# Patient Record
Sex: Male | Born: 1960
Health system: Southern US, Community
[De-identification: ages and names within clinical notes are randomized; demographics above are authoritative.]

## PROBLEM LIST (undated history)

## (undated) DIAGNOSIS — F329 Major depressive disorder, single episode, unspecified: Secondary | ICD-10-CM

## (undated) DIAGNOSIS — M779 Enthesopathy, unspecified: Secondary | ICD-10-CM

## (undated) DIAGNOSIS — R200 Anesthesia of skin: Secondary | ICD-10-CM

## (undated) DIAGNOSIS — J029 Acute pharyngitis, unspecified: Secondary | ICD-10-CM

## (undated) DIAGNOSIS — Z8619 Personal history of other infectious and parasitic diseases: Secondary | ICD-10-CM

## (undated) DIAGNOSIS — Z87898 Personal history of other specified conditions: Secondary | ICD-10-CM

## (undated) DIAGNOSIS — K219 Gastro-esophageal reflux disease without esophagitis: Secondary | ICD-10-CM

## (undated) DIAGNOSIS — Z8719 Personal history of other diseases of the digestive system: Secondary | ICD-10-CM

## (undated) DIAGNOSIS — G44209 Tension-type headache, unspecified, not intractable: Secondary | ICD-10-CM

## (undated) DIAGNOSIS — T4145XA Adverse effect of unspecified anesthetic, initial encounter: Secondary | ICD-10-CM

## (undated) DIAGNOSIS — F32A Depression, unspecified: Secondary | ICD-10-CM

## (undated) DIAGNOSIS — J329 Chronic sinusitis, unspecified: Secondary | ICD-10-CM

## (undated) DIAGNOSIS — R112 Nausea with vomiting, unspecified: Secondary | ICD-10-CM

## (undated) DIAGNOSIS — Z9889 Other specified postprocedural states: Secondary | ICD-10-CM

## (undated) DIAGNOSIS — N529 Male erectile dysfunction, unspecified: Secondary | ICD-10-CM

## (undated) DIAGNOSIS — E7800 Pure hypercholesterolemia, unspecified: Secondary | ICD-10-CM

## (undated) DIAGNOSIS — G5710 Meralgia paresthetica, unspecified lower limb: Secondary | ICD-10-CM

## (undated) DIAGNOSIS — E559 Vitamin D deficiency, unspecified: Secondary | ICD-10-CM

## (undated) DIAGNOSIS — T8859XA Other complications of anesthesia, initial encounter: Secondary | ICD-10-CM

## (undated) HISTORY — PX: NASAL SEPTUM SURGERY: SHX37

## (undated) HISTORY — DX: Chronic sinusitis, unspecified: J32.9

## (undated) HISTORY — PX: LAPAROSCOPY: SHX197

## (undated) HISTORY — DX: Personal history of other specified conditions: Z87.898

## (undated) HISTORY — DX: Personal history of other infectious and parasitic diseases: Z86.19

## (undated) HISTORY — DX: Acute pharyngitis, unspecified: J02.9

## (undated) HISTORY — DX: Major depressive disorder, single episode, unspecified: F32.9

## (undated) HISTORY — DX: Male erectile dysfunction, unspecified: N52.9

## (undated) HISTORY — DX: Vitamin D deficiency, unspecified: E55.9

## (undated) HISTORY — DX: Pure hypercholesterolemia, unspecified: E78.00

## (undated) HISTORY — DX: Meralgia paresthetica, unspecified lower limb: G57.10

## (undated) HISTORY — PX: HERNIA REPAIR: SHX51

## (undated) HISTORY — DX: Depression, unspecified: F32.A

## (undated) HISTORY — DX: Enthesopathy, unspecified: M77.9

## (undated) HISTORY — DX: Gastro-esophageal reflux disease without esophagitis: K21.9

## (undated) HISTORY — DX: Anesthesia of skin: R20.0

## (undated) HISTORY — PX: MYRINGOTOMY: SUR874

## (undated) HISTORY — DX: Tension-type headache, unspecified, not intractable: G44.209

---

## 2006-12-05 DIAGNOSIS — Z720 Tobacco use: Secondary | ICD-10-CM | POA: Insufficient documentation

## 2006-12-05 DIAGNOSIS — K2100 Gastro-esophageal reflux disease with esophagitis, without bleeding: Secondary | ICD-10-CM | POA: Insufficient documentation

## 2007-08-22 ENCOUNTER — Ambulatory Visit: Payer: Self-pay | Admitting: Unknown Physician Specialty

## 2007-08-22 LAB — HM COLONOSCOPY

## 2009-03-03 ENCOUNTER — Ambulatory Visit: Payer: Self-pay | Admitting: Family Medicine

## 2009-08-15 DIAGNOSIS — F32A Depression, unspecified: Secondary | ICD-10-CM | POA: Insufficient documentation

## 2011-01-26 IMAGING — CR DG LUMBAR SPINE 2-3V
1 series · 3 of 3 positions shown · non-contrast
Comparison: none

REASON FOR EXAM: back pain
COMMENTS:

[Series 2: view not recorded · 0.17mm/px · 3 of 3 slices shown]
[im 1/3]
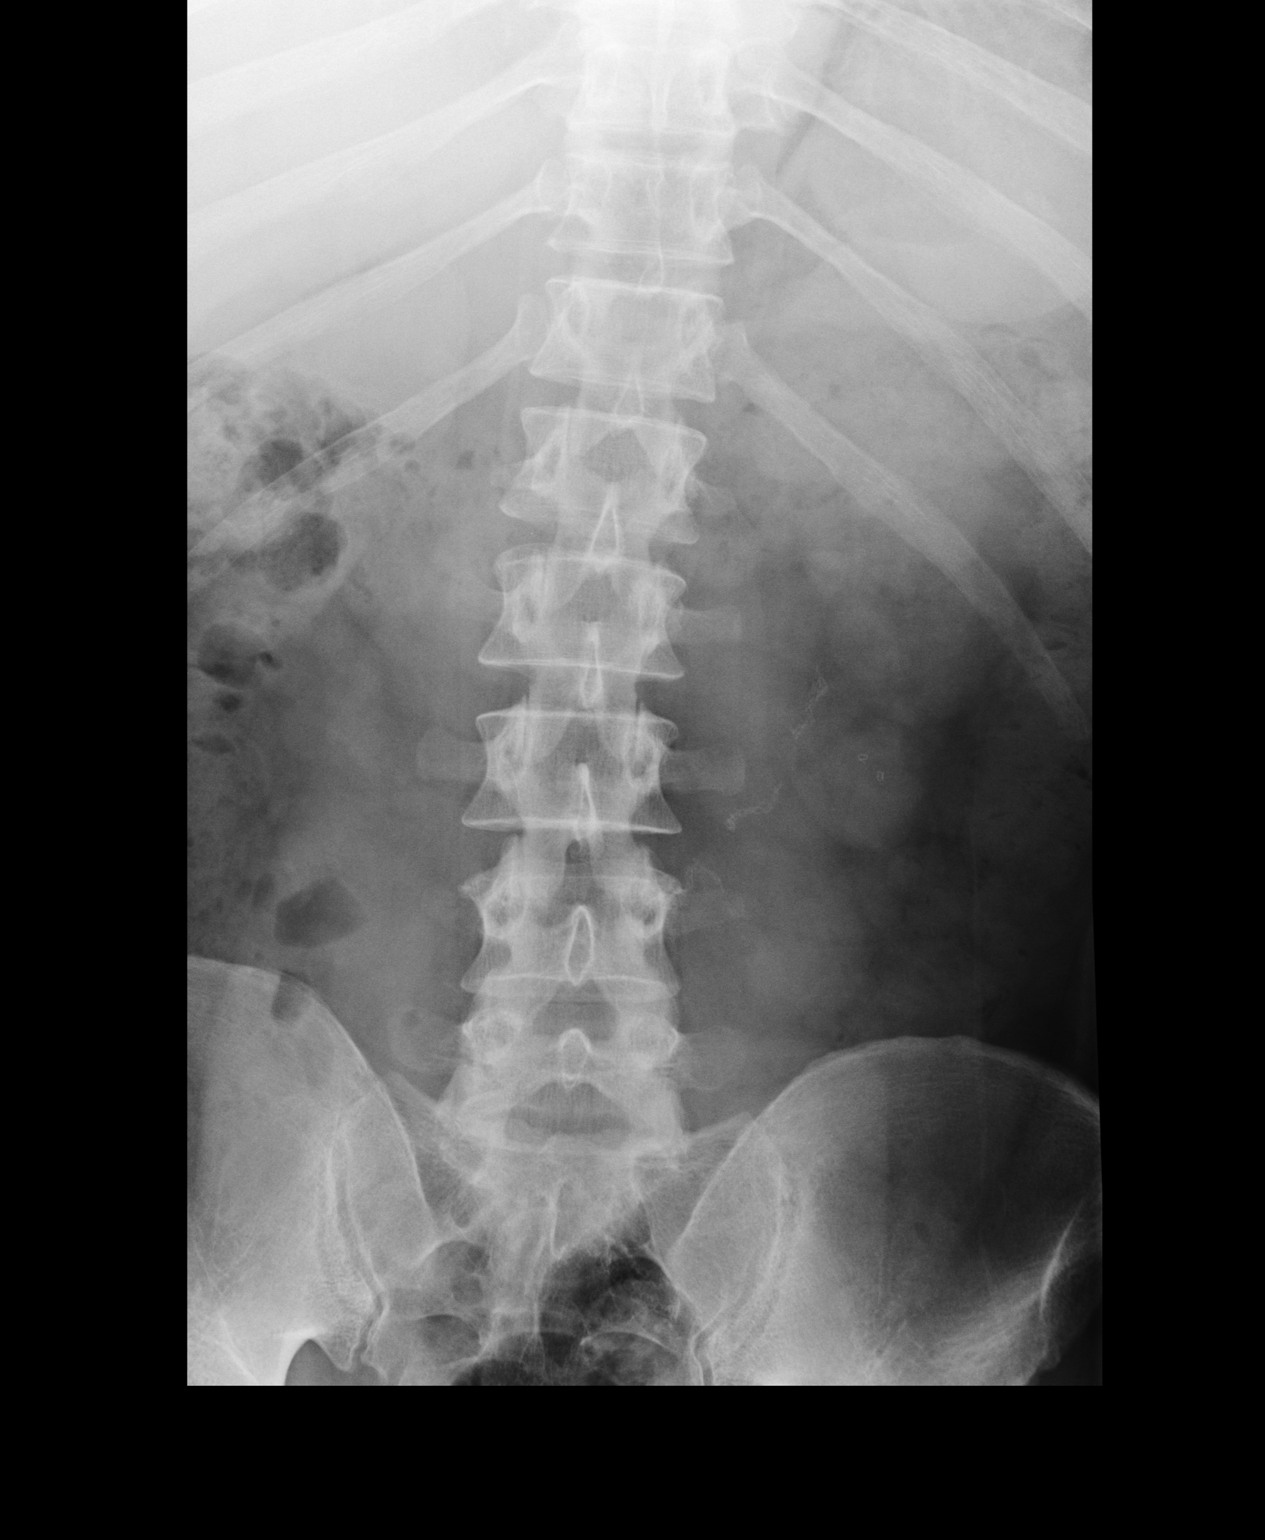
[im 2/3]
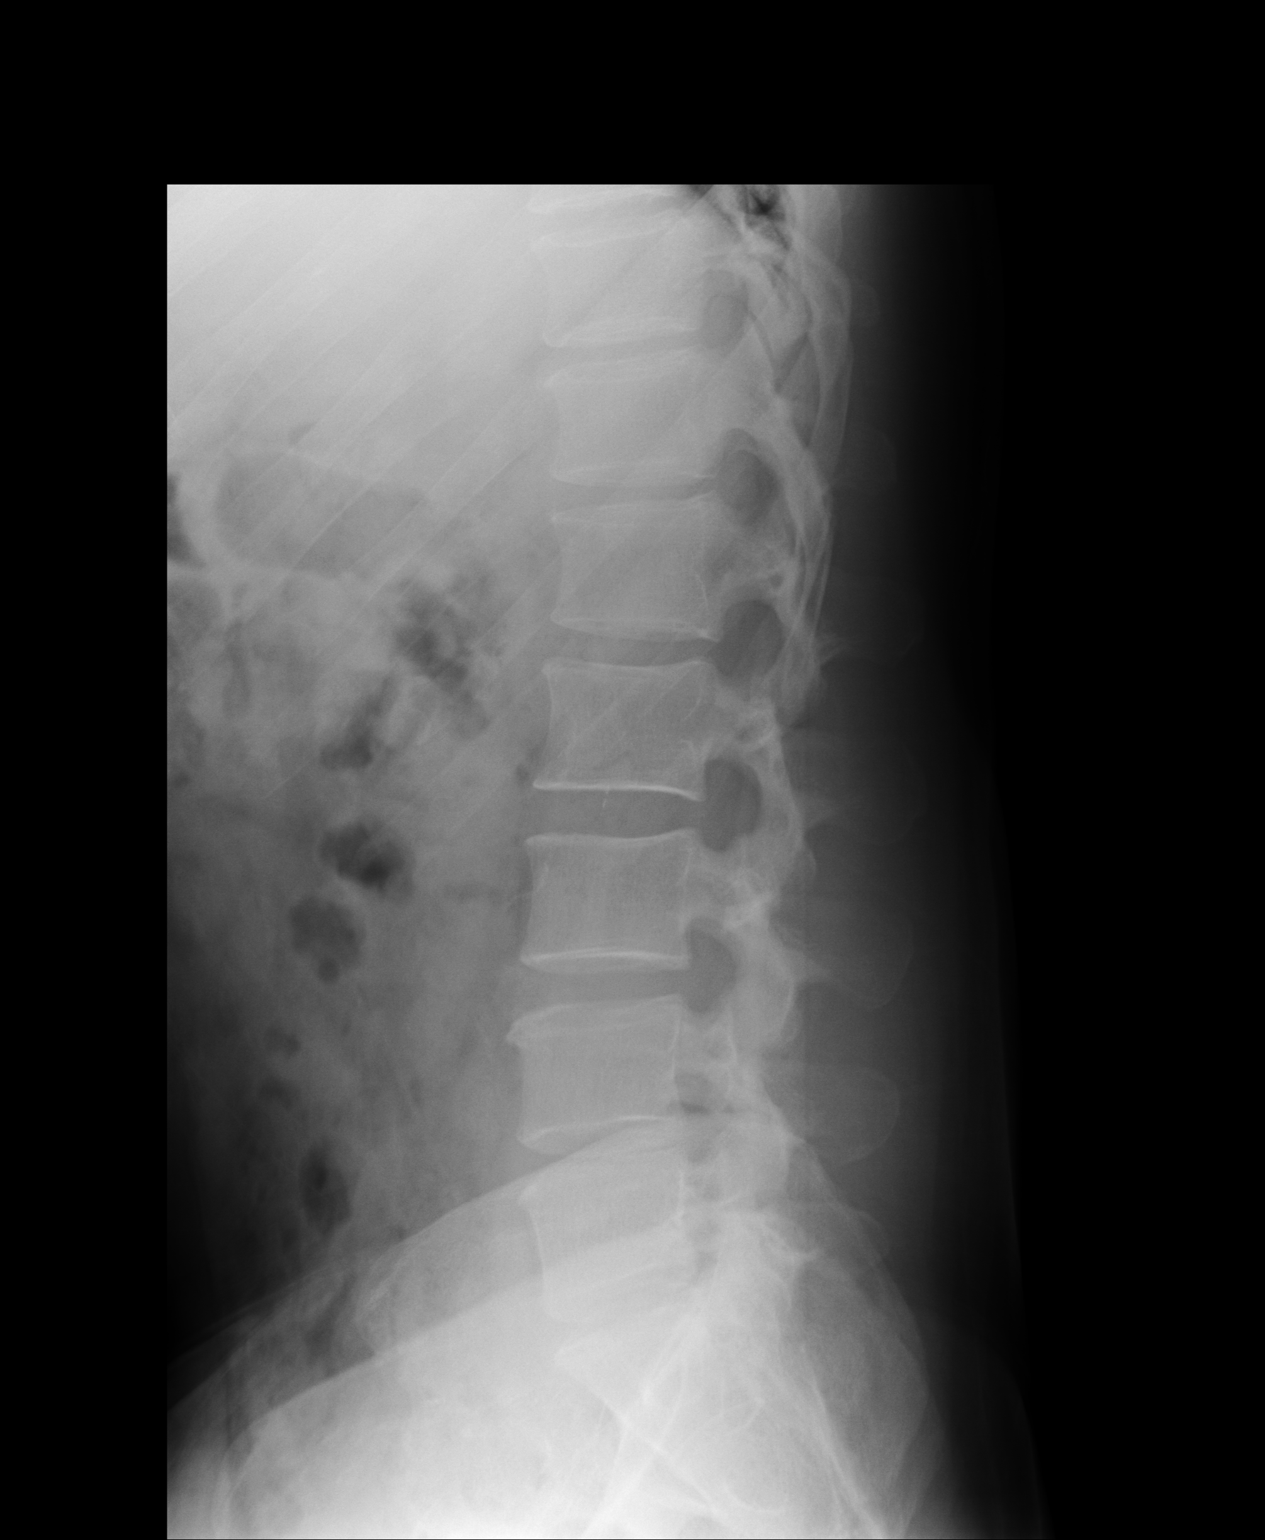
[im 3/3]
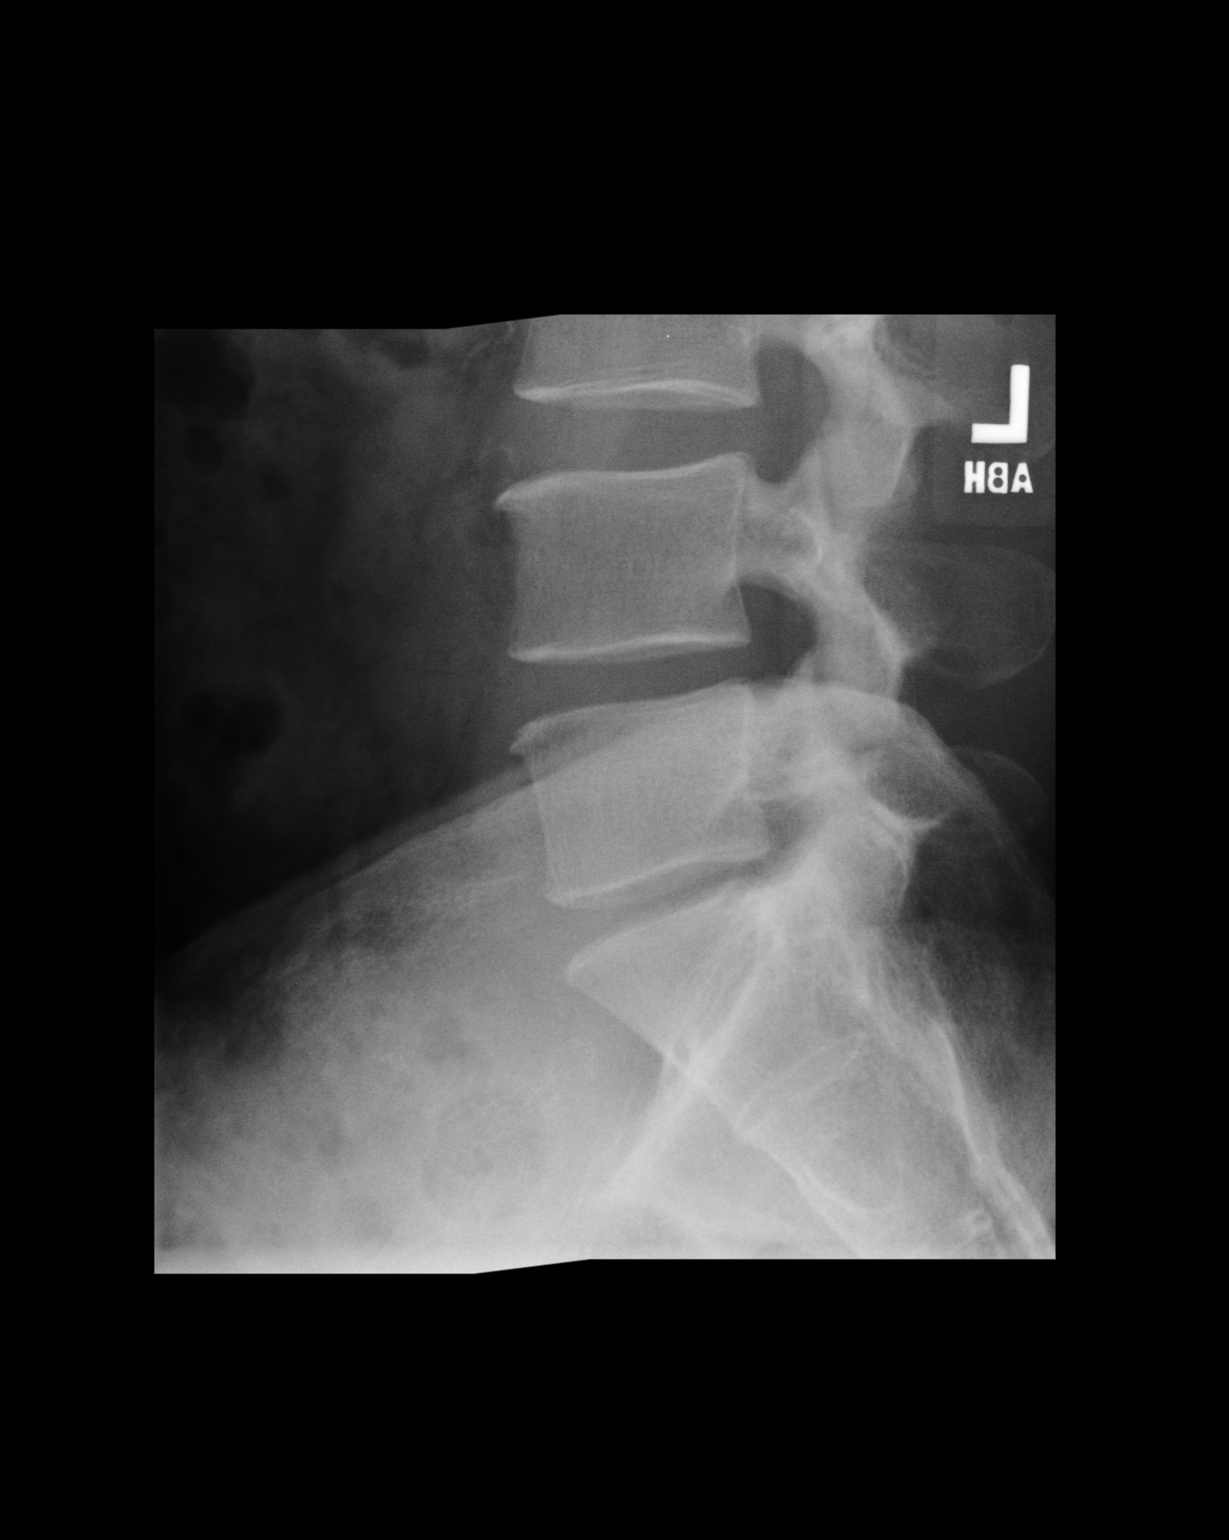

[3 of 3 positions shown; findings below may reference images not displayed]

PROCEDURE:     KDR - KDXR LUMBAR SPINE AP AND LATERAL  - March 03, 2009  [DATE]

RESULT:     Surgical changes are seen to the left of the spine in the mid
abdomen. The vertebral body heights and intervertebral disc spaces are
maintained with some slight narrowing at L5-S1. There is no compression
deformity or subluxation. There is no fracture or destructive lesion. There
is no sclerosis.
IMPRESSION: Minimal degenerative changes. Otherwise unremarkable exam.
MRI is available for further assessment, if desired.

## 2011-11-28 ENCOUNTER — Ambulatory Visit: Payer: Self-pay | Admitting: Surgery

## 2012-11-11 ENCOUNTER — Ambulatory Visit: Payer: Self-pay | Admitting: Family Medicine

## 2012-11-25 ENCOUNTER — Encounter: Payer: Self-pay | Admitting: Cardiovascular Disease

## 2012-11-25 ENCOUNTER — Ambulatory Visit (INDEPENDENT_AMBULATORY_CARE_PROVIDER_SITE_OTHER): Payer: No Typology Code available for payment source | Admitting: Cardiovascular Disease

## 2012-11-25 VITALS — BP 124/80 | HR 87 | Ht 68.0 in | Wt 183.5 lb

## 2012-11-25 DIAGNOSIS — R002 Palpitations: Secondary | ICD-10-CM

## 2012-11-25 DIAGNOSIS — E785 Hyperlipidemia, unspecified: Secondary | ICD-10-CM | POA: Insufficient documentation

## 2012-11-25 DIAGNOSIS — R5381 Other malaise: Secondary | ICD-10-CM

## 2012-11-25 DIAGNOSIS — R5383 Other fatigue: Secondary | ICD-10-CM

## 2012-11-25 MED ORDER — PROPRANOLOL HCL 20 MG PO TABS: 20.0000 mg | ORAL_TABLET | Freq: Three times a day (TID) | ORAL | Status: DC | PRN

## 2012-11-25 NOTE — Patient Instructions (Addendum)
You are doing well. No medication changes were made.  Try Red Yeast Rice for cholesterol  Please call us if you have new issues that need to be addressed before your next appt.     

## 2012-11-25 NOTE — Progress Notes (Signed)
Patient ID: Ronald M Welch Jr., male    DOB: 07-21-1960, 52 y.o.   MRN: [id number]  HPI Comments: Ronald Welch is a very pleasant 52 year old gentleman with a history of GERD, ADD, prostate issues, patient of Dr. Sullivan Lone who presents for evaluation of palpitations, fatigue. He has a history of hyperlipidemia, cholesterol he believes of 230.  He reports that in the past several weeks, he has had significant stress at work. He is self-employed, had several projects Causing significant stress. At the same time, he had a inner ear infection and was trying to wean himself off his amphetamine for ADD. He wonders if it was too much. He had profound fatigue and is going to sleep at 8:00 at nighttime and sleeping until early morning. Going to bed, he had significant heart fluttering, irregular beats, mixed with very strong beats. He had one episode of extreme fatigue while working in the yard trying to install a dog fence. Since his work stress has improved and he has gone back on his regular dose of ADD medication, which is below the prescription dose, he has felt better. He has been very active, working very hard in the yard. Work has been less stressful and he feels better overall. He denies any significant chest pain or shortness of breath with heavy exertion recently.  He does report having a stress test in 2006. At that time he was diagnosed with GERD.   he reports that his father has ahistory of coronary artery disease, MI in his 78s.  EKG today shows normal sinus rhythm with rate 87 beats per minute with no significant ST or T wave changes, PVC noted    Outpatient Encounter Prescriptions as of 11/25/2012  Medication Sig Dispense Refill  . amphetamine-dextroamphetamine (ADDERALL) 20 MG tablet Take 20 mg by mouth 2 (two) times daily as needed.      . CYCLOBENZAPRINE HCL PO Take 10 mg by mouth daily as needed.       . finasteride (PROSCAR) 5 MG tablet Take 5 mg by mouth daily.      Marland Kitchen omeprazole  (PRILOSEC) 20 MG capsule Take 20 mg by mouth daily.      . Probiotic Product (PROBIOTIC DAILY PO) Take by mouth daily.      . ranitidine (ZANTAC) 300 MG tablet Take 300 mg by mouth at bedtime.      . tamsulosin (FLOMAX) 0.4 MG CAPS Take by mouth.      Marland Kitchen VITAMIN D, ERGOCALCIFEROL, PO Take 5,000 mg by mouth daily.        Review of Systems  Constitutional: Positive for fatigue.  HENT: Negative.   Eyes: Negative.   Respiratory: Negative.   Cardiovascular: Positive for palpitations.  Gastrointestinal: Negative.   Musculoskeletal: Negative.   Skin: Negative.   Neurological: Negative.   Psychiatric/Behavioral: Negative.   All other systems reviewed and are negative.    BP 124/80  Pulse 87  Ht 5\' 8"  (1.727 m)  Wt 183 lb 8 oz (83.235 kg)  BMI 27.91 kg/m2  Physical Exam  Nursing note and vitals reviewed. Constitutional: He is oriented to person, place, and time. He appears well-developed and well-nourished.  HENT:  Head: Normocephalic.  Nose: Nose normal.  Mouth/Throat: Oropharynx is clear and moist.  Eyes: Conjunctivae are normal. Pupils are equal, round, and reactive to light.  Neck: Normal range of motion. Neck supple. No JVD present.  Cardiovascular: Normal rate, regular rhythm, S1 normal, S2 normal, normal heart sounds and intact distal pulses.  Exam reveals no gallop and no friction rub.   No murmur heard. Pulmonary/Chest: Effort normal and breath sounds normal. No respiratory distress. He has no wheezes. He has no rales. He exhibits no tenderness.  Abdominal: Soft. Bowel sounds are normal. He exhibits no distension. There is no tenderness.  Musculoskeletal: Normal range of motion. He exhibits no edema and no tenderness.  Lymphadenopathy:    He has no cervical adenopathy.  Neurological: He is alert and oriented to person, place, and time. Coordination normal.  Skin: Skin is warm and dry. No rash noted. No erythema.  Psychiatric: He has a normal mood and affect. His behavior  is normal. Judgment and thought content normal.      Assessment and Plan

## 2012-11-25 NOTE — Assessment & Plan Note (Signed)
PVCs seen on EKG today. I suspect he may of had ectopy while trying to change his Adderall dosing, work stress, while having in your infection. If symptoms present again, we have given him a prescription for propranolol to take when necessary. We could also perform a Holter monitor if needed.Marland Kitchen

## 2012-11-25 NOTE — Assessment & Plan Note (Signed)
Etiology of his fatigue is uncertain. It has improved as his work stress has resolved. Also better as he is taking a regular per small dose of his Adderall daily. As he feels well and is active and able to exert himself without any symptoms over the past week and this past weekend, we will hold off on any further testing at this time. If symptoms recur, we have offered him echocardiogram and stress testing.

## 2012-11-25 NOTE — Assessment & Plan Note (Addendum)
He reports cholesterol is high, approximately 230. He prefers not to take prescription medication. Father recently had MI and stent placement in his 64s. He will try red yeast rice.

## 2013-07-01 ENCOUNTER — Ambulatory Visit: Payer: No Typology Code available for payment source | Admitting: Cardiovascular Disease

## 2013-07-16 ENCOUNTER — Ambulatory Visit: Payer: No Typology Code available for payment source | Admitting: Cardiovascular Disease

## 2013-07-31 ENCOUNTER — Ambulatory Visit: Payer: No Typology Code available for payment source | Admitting: Cardiovascular Disease

## 2013-08-13 ENCOUNTER — Ambulatory Visit: Payer: No Typology Code available for payment source | Admitting: Cardiovascular Disease

## 2013-09-11 ENCOUNTER — Ambulatory Visit: Payer: No Typology Code available for payment source | Admitting: Cardiovascular Disease

## 2013-09-29 ENCOUNTER — Encounter (INDEPENDENT_AMBULATORY_CARE_PROVIDER_SITE_OTHER): Payer: Self-pay

## 2013-09-29 ENCOUNTER — Ambulatory Visit (INDEPENDENT_AMBULATORY_CARE_PROVIDER_SITE_OTHER): Payer: 59 | Admitting: Cardiovascular Disease

## 2013-09-29 ENCOUNTER — Encounter: Payer: Self-pay | Admitting: Cardiovascular Disease

## 2013-09-29 VITALS — BP 110/70 | HR 88 | Ht 68.0 in | Wt 179.2 lb

## 2013-09-29 DIAGNOSIS — N400 Enlarged prostate without lower urinary tract symptoms: Secondary | ICD-10-CM

## 2013-09-29 DIAGNOSIS — R002 Palpitations: Secondary | ICD-10-CM

## 2013-09-29 DIAGNOSIS — E785 Hyperlipidemia, unspecified: Secondary | ICD-10-CM

## 2013-09-29 DIAGNOSIS — R55 Syncope and collapse: Secondary | ICD-10-CM

## 2013-09-29 NOTE — Assessment & Plan Note (Signed)
Propranolol could be used for symptomatic palpitations. This is not a major issue on today's visit

## 2013-09-29 NOTE — Assessment & Plan Note (Signed)
He is currently on 2 medications as detailed above. We have suggested he try changing the timing of these medications and does not take them both together in the morning

## 2013-09-29 NOTE — Patient Instructions (Addendum)
You are doing well.  Try to take the prostate medications at alternate times to avoid near-syncope   We will check you lipids Try Red Yeast Rice for cholesterol if numbers are high   Please call us if you have new issues that need to be addressed before your next appt.  Your physician wants you to follow-up in: 12 months.  You will receive a reminder letter in the mail two months in advance. If you don't receive a letter, please call our office to schedule the follow-up appointment.

## 2013-09-29 NOTE — Assessment & Plan Note (Signed)
Long discussion today concerning his cholesterol. He does have a strong family history of CAD. We will recheck his lipids at his convenience. He would like to try red yeast rice.

## 2013-09-29 NOTE — Progress Notes (Signed)
Patient ID: Ronald Welch Jr., male    DOB: [birth year], 53 y.o.   MRN: [id number]  HPI Comments: Mr. Welch is a very pleasant 53 year old gentleman with a history of GERD, ADD, prostate issues, patient of Dr. Sullivan LoneGilbert who previously presented with symptoms of palpitations, fatigue. Symptoms felt secondary to stress and trying to wean himself off his  amphetamine for ADD.  He has a history of hyperlipidemia, cholesterol he believes of 230.  In followup today, he reports having 5-10 episodes of lightheadedness with a sensation like his heart is stopping. Last one happened in January while he was driving. Typically this does not happen with exertion, more commonly at rest. Possible associated palpitation.feeling like his heart is stopping and starting with a long pause. Prior EKG did show PVCs Previously we recommended he try red yeast rice for hyperlipidemia.He has not tried this  He does report having a stress test in 2006. At that time he was diagnosed with GERD.  he reports that his father has a history of coronary artery disease, MI in his 4170s.   EKG today shows normal sinus rhythm  with no significant ST or T wave changes, PVC noted    Outpatient Encounter Prescriptions as of 09/29/2013  Medication Sig  . ALPRAZolam (XANAX) 0.5 MG tablet Take 0.5 mg by mouth every 8 (eight) hours as needed for anxiety.  Marland Kitchen. amphetamine-dextroamphetamine (ADDERALL) 20 MG tablet Take 20 mg by mouth 2 (two) times daily as needed.  . CYCLOBENZAPRINE HCL PO Take 10 mg by mouth daily as needed.   . finasteride (PROSCAR) 5 MG tablet Take 5 mg by mouth daily.  Marland Kitchen. omeprazole (PRILOSEC) 20 MG capsule Take 20 mg by mouth daily.  . Probiotic Product (PROBIOTIC DAILY PO) Take by mouth daily.  . propranolol (INDERAL) 20 MG tablet Take 1 tablet (20 mg total) by mouth 3 (three) times daily as needed.  . ranitidine (ZANTAC) 300 MG tablet Take 300 mg by mouth at bedtime.  . tamsulosin (FLOMAX) 0.4 MG CAPS Take by mouth.  Marland Kitchen.  VITAMIN D, ERGOCALCIFEROL, PO Take 5,000 mg by mouth daily.    Review of Systems  HENT: Negative.   Eyes: Negative.   Respiratory: Negative.   Cardiovascular: Positive for palpitations.  Gastrointestinal: Negative.   Endocrine: Negative.   Musculoskeletal: Negative.   Skin: Negative.   Allergic/Immunologic: Negative.   Neurological: Positive for dizziness.  Hematological: Negative.   Psychiatric/Behavioral: Negative.   All other systems reviewed and are negative.   BP 110/70  Pulse 88  Ht 5\' 8"  (1.727 m)  Wt 179 lb 4 oz (81.307 kg)  BMI 27.26 kg/m2  Physical Exam  Nursing note and vitals reviewed. Constitutional: He is oriented to person, place, and time. He appears well-developed and well-nourished.  HENT:  Head: Normocephalic.  Nose: Nose normal.  Mouth/Throat: Oropharynx is clear and moist.  Eyes: Conjunctivae are normal. Pupils are equal, round, and reactive to light.  Neck: Normal range of motion. Neck supple. No JVD present.  Cardiovascular: Normal rate, regular rhythm, S1 normal, S2 normal, normal heart sounds and intact distal pulses.  Exam reveals no gallop and no friction rub.   No murmur heard. Pulmonary/Chest: Effort normal and breath sounds normal. No respiratory distress. He has no wheezes. He has no rales. He exhibits no tenderness.  Abdominal: Soft. Bowel sounds are normal. He exhibits no distension. There is no tenderness.  Musculoskeletal: Normal range of motion. He exhibits no edema and no tenderness.  Lymphadenopathy:  He has no cervical adenopathy.  Neurological: He is alert and oriented to person, place, and time. Coordination normal.  Skin: Skin is warm and dry. No rash noted. No erythema.  Psychiatric: He has a normal mood and affect. His behavior is normal. Judgment and thought content normal.      Assessment and Plan

## 2013-09-29 NOTE — Assessment & Plan Note (Addendum)
He has had 5-10 episodes of near syncope over the past year. Possibly associated with extra beats, typically at rest. Unable to exclude arrhythmia and 30 day monitor could be done if symptoms persist. Last episode in January 2015 while he was driving. We did discuss the possibility that this could be from his prostate medication which he takes in the morning including his Proscar and Flomax. Unable to exclude orthostasis. Suggested he try to take these medications later in the day or at dinnertime. If symptoms persist we have suggested he call our office for further workup.he's not having frequent symptoms of palpitations and has not been taking propranolol.

## 2013-10-13 LAB — CBC AND DIFFERENTIAL
HEMATOCRIT: 47 % (ref 41–53)
Hemoglobin: 16.4 g/dL (ref 13.5–17.5)
Neutrophils Absolute: 3 /uL
PLATELETS: 268 10*3/uL (ref 150–399)
WBC: 4.9 10^3/mL

## 2013-10-13 LAB — LIPID PANEL
CHOLESTEROL: 221 mg/dL — AB (ref 0–200)
HDL: 61 mg/dL (ref 35–70)
LDL CALC: 139 mg/dL
LDl/HDL Ratio: 2.3
Triglycerides: 107 mg/dL (ref 40–160)

## 2013-10-13 LAB — BASIC METABOLIC PANEL
BUN: 14 mg/dL (ref 4–21)
CREATININE: 1.3 mg/dL (ref 0.6–1.3)
GLUCOSE: 92 mg/dL
POTASSIUM: 5.1 mmol/L (ref 3.4–5.3)
Sodium: 139 mmol/L (ref 137–147)

## 2013-10-13 LAB — HEPATIC FUNCTION PANEL
ALT: 26 U/L (ref 10–40)
AST: 22 U/L (ref 14–40)
Alkaline Phosphatase: 60 U/L (ref 25–125)
Bilirubin, Total: 0.4 mg/dL

## 2013-10-13 LAB — TSH: TSH: 1.22 u[IU]/mL (ref 0.41–5.90)

## 2013-10-13 LAB — PSA: PSA: 1.5

## 2014-10-06 IMAGING — CR DG CHEST 2V
1 series · 2 of 2 positions shown · non-contrast
Comparison: none

REASON FOR EXAM: chest tightness
COMMENTS:

PROCEDURE:     KDR - KDXR CHEST PA (OR AP) AND LAT  - November 11, 2012  [DATE]
RESULT:     Comparison: None.

[Series 1: pa · 0.17mm/px · 2 of 2 slices shown]
[im 1/2]
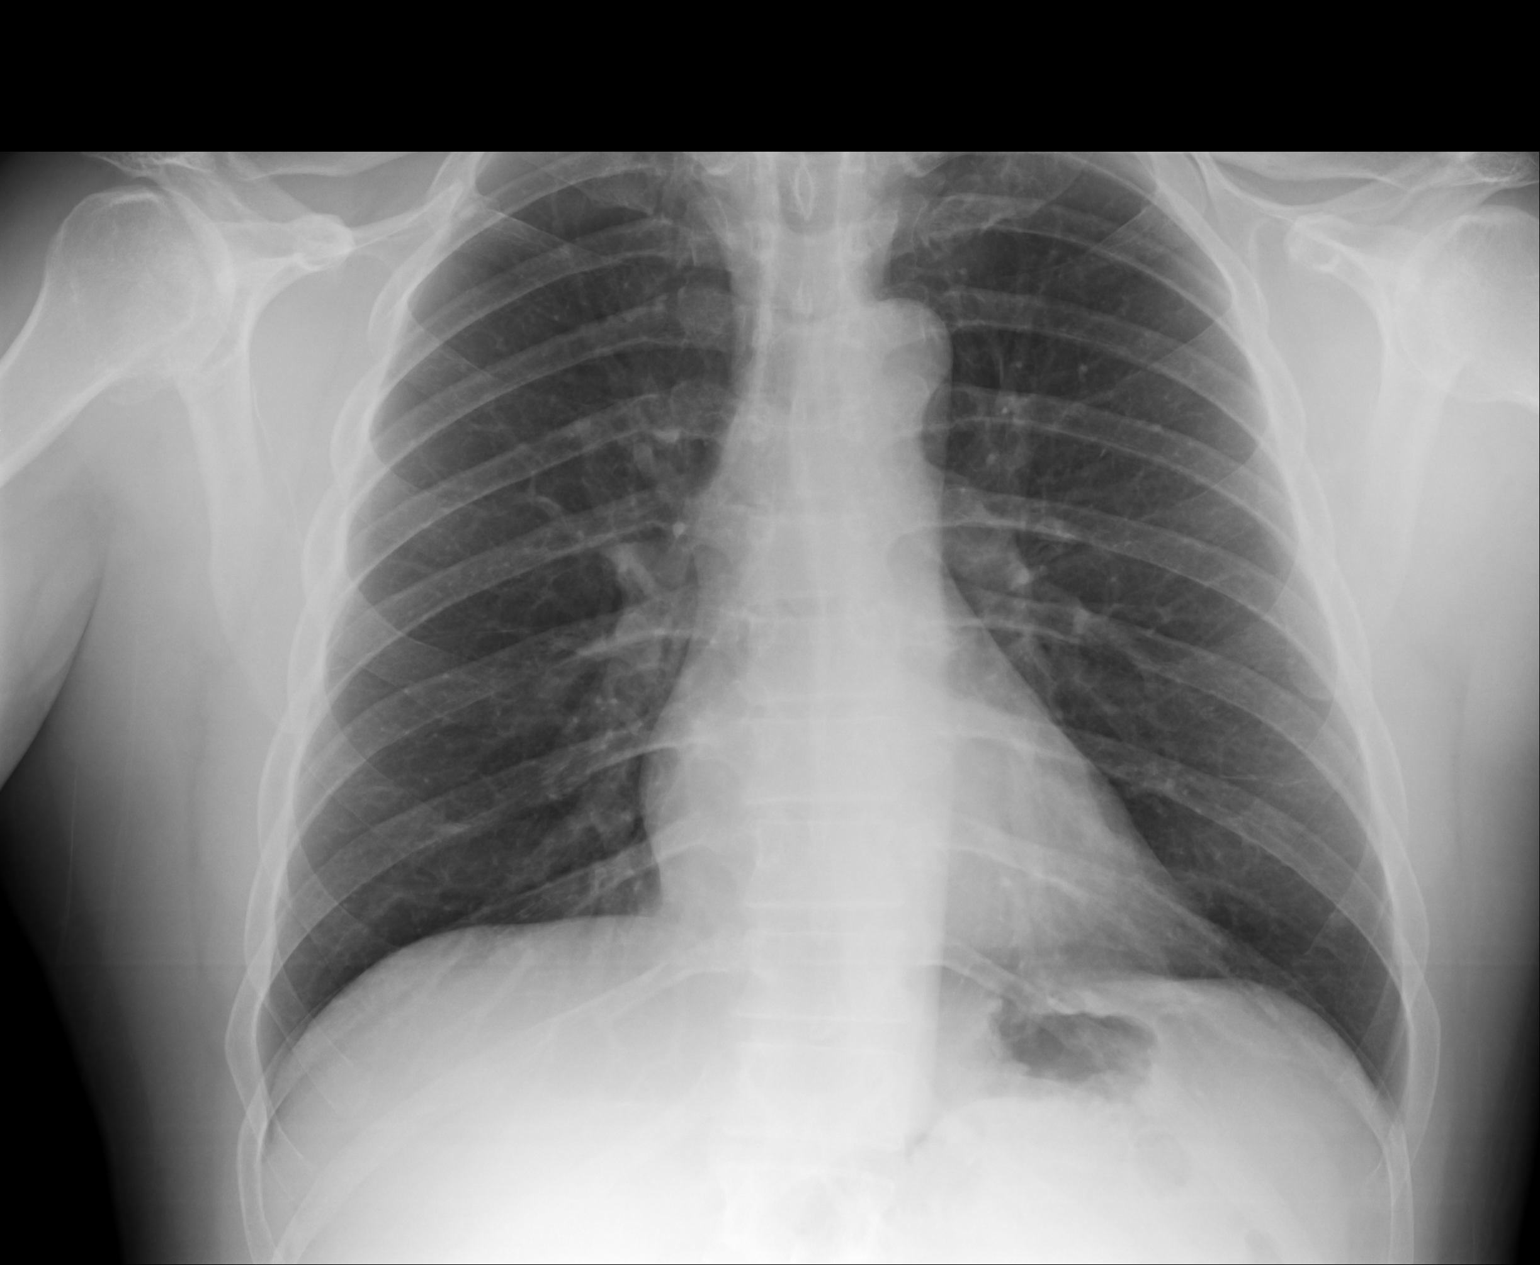
[im 2/2]
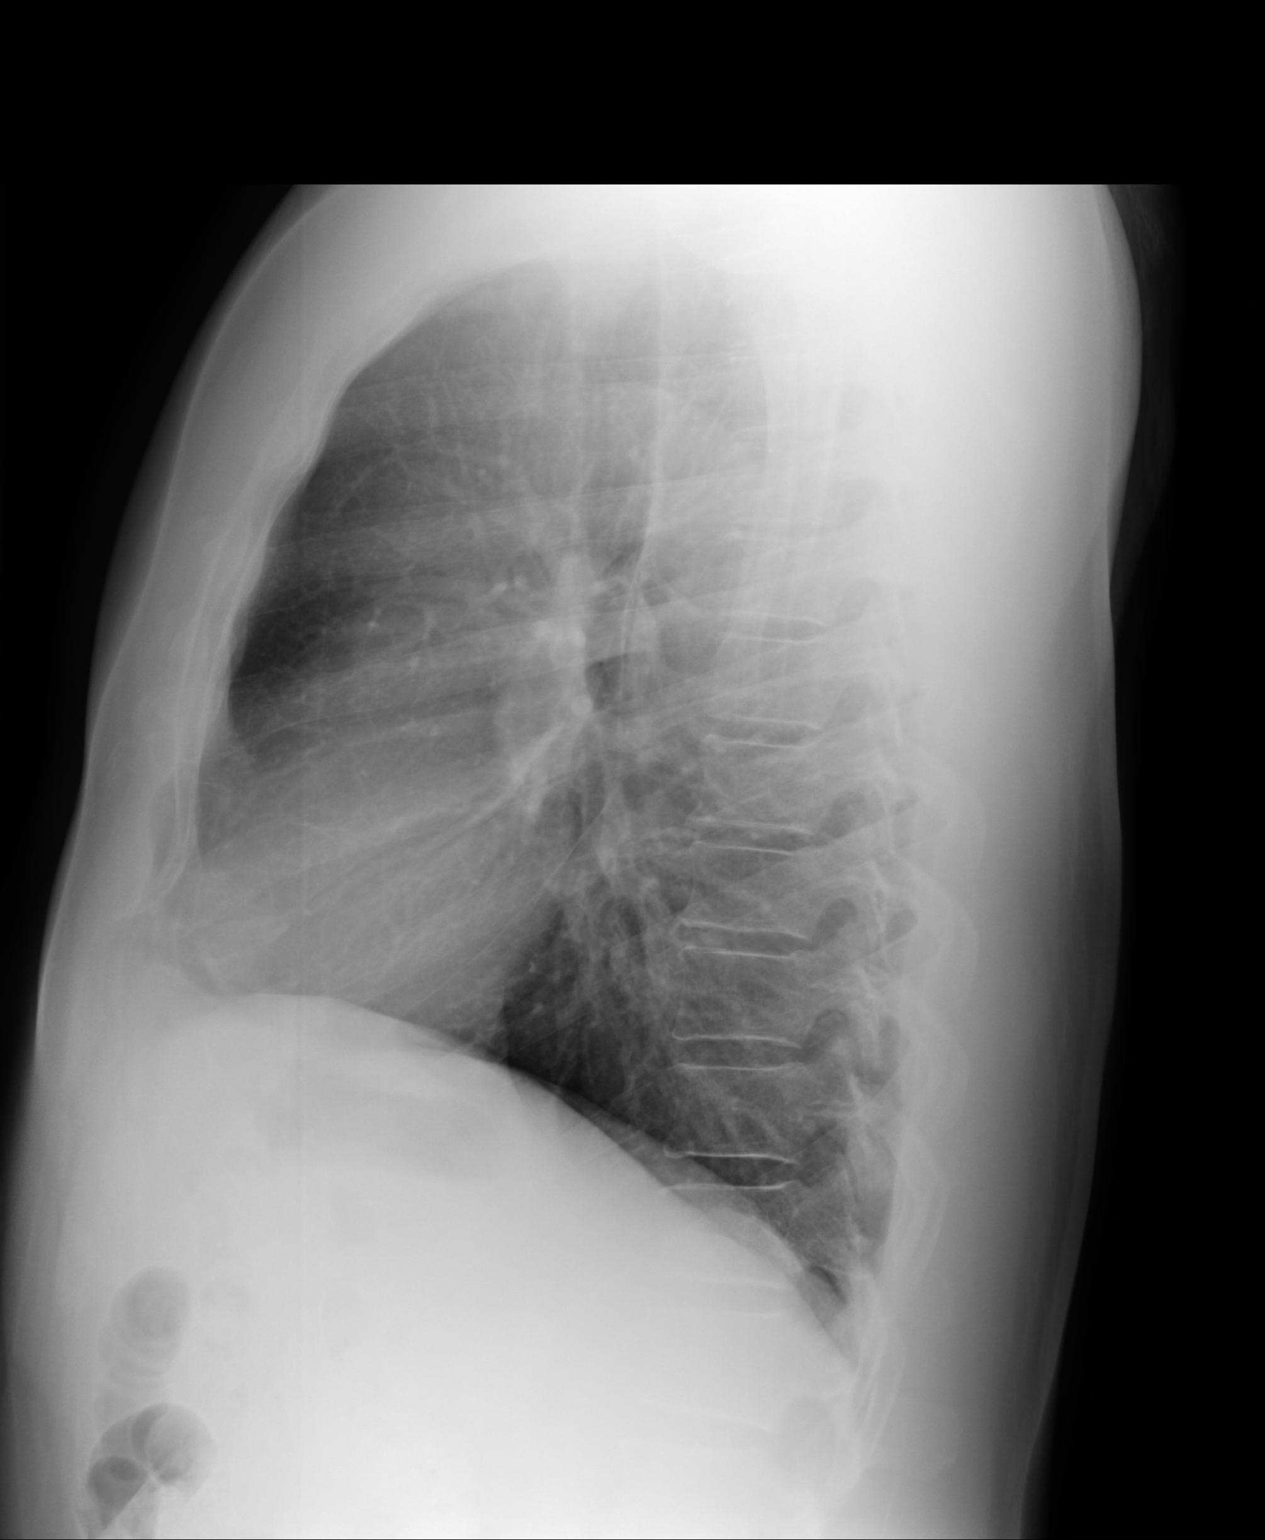

[2 of 2 positions shown; findings below may reference images not displayed]

FINDINGS: The heart and mediastinum are within normal limits. No focal pulmonary
opacities. Subcentimeter nodular density at the periphery of the right upper
lung is likely calcified or related to overlying rib given its density to
small size.
IMPRESSION: 1. No acute cardiopulmonary disease.
2. Subcentimeter nodular density at the periphery the right upper lung is
likely calcified or related to overlying first rib given its density to
small size. However, followup chest radiograph or comparison with outside
prior chest radiographs is recommended to ensure stability.

[REDACTED]

## 2014-10-10 NOTE — Op Note (Signed)
PATIENT NAME:  Ronald Welch, Ronald Welch MR#:  [id number] DATE OF BIRTH:  1960/09/01  DATE OF PROCEDURE:  11/28/2011  PREOPERATIVE DIAGNOSIS: Left inguinal hernia.   POSTOPERATIVE DIAGNOSIS: Left inguinal hernia.   PROCEDURE: Left inguinal hernia repair.   SURGEON: Adella HareJ. Wilton Smith, MD  ANESTHESIA: General.   INDICATIONS: This 54 year old male came into the office with chief complaint of bulging in the left groin. A left inguinal hernia was demonstrated on physical exam and surgery was recommended for definitive treatment.   DESCRIPTION OF PROCEDURE: The patient was placed on the operating table in the supine position under general anesthesia. The abdomen was clipped and prepared with ChloraPrep, draped in a sterile manner.   A left lower quadrant transversely oriented suprapubic incision was made, carried down through subcutaneous tissues. Several small bleeding points were electrocauterized. The Scarpa's fascia was incised and dissection was carried down to the external oblique aponeurosis which was incised along the course of its fibers to open the external ring and expose the inguinal cord structures. The cord structures were mobilized. Cremaster fibers were spread to examine the cord structures. There was no indirect sac found. Cord structures were then retracted and identified a direct inguinal hernia in the floor of the inguinal canal. This was grasped with a Kelly clamp and dissected free from surrounding structures. Next the attenuated transversalis fascia was incised circumferentially around the defect and the attenuated transversalis fascia was removed. Did not need to be sent for pathology. Next, the properitoneal fat and sac were separated from the fascial ring defect and inverted. The fascial ring defect itself was approximately 12 mm in dimension. This was closed with 0 Surgilon simple suture and the last stitch led to satisfactory narrowing of the internal ring. Next, an onlay Atrium mesh was  selected. This was cut to create an oval shape of some 2.5 x 3.5 cm. A notch was cut out to straddle the cord structures. The mesh was inserted and sutured to the repair with 0 Surgilon and also was sutured medially along the edges. The repair looked good. The cord structures and ilioinguinal nerve were replaced along the floor of the inguinal canal. Cut edges of the external oblique aponeurosis were closed with running 4-0 Vicryl. The fascia superior and lateral to the repair site was infiltrated with 0.5% Sensorcaine with epinephrine and also subcutaneous tissues were infiltrated. Next, the Scarpa's fascia was closed with interrupted 4-0 Monocryl sutures. The skin was closed with running 4-0 Monocryl subcuticular suture and Dermabond. The patient tolerated procedure satisfactorily. His testicle remained in the scrotum and he was prepared for transfer to the recovery room.  ____________________________ Shela CommonsJ. Renda RollsWilton Smith, MD jws:cms D: 11/28/2011 08:57:49 ET T: 11/28/2011 10:51:27 ET JOB#: [id number]  cc: Adella HareJ. Wilton Smith, MD, <Dictator> Adella HareWILTON J SMITH MD ELECTRONICALLY SIGNED 11/28/2011 18:51

## 2014-11-24 ENCOUNTER — Other Ambulatory Visit: Payer: Self-pay

## 2014-11-24 MED ORDER — TAMSULOSIN HCL 0.4 MG PO CAPS: 0.4000 mg | ORAL_CAPSULE | Freq: Every day | ORAL | Status: DC

## 2014-12-17 DIAGNOSIS — K469 Unspecified abdominal hernia without obstruction or gangrene: Secondary | ICD-10-CM | POA: Insufficient documentation

## 2014-12-17 DIAGNOSIS — F988 Other specified behavioral and emotional disorders with onset usually occurring in childhood and adolescence: Secondary | ICD-10-CM | POA: Insufficient documentation

## 2014-12-17 DIAGNOSIS — E559 Vitamin D deficiency, unspecified: Secondary | ICD-10-CM | POA: Insufficient documentation

## 2014-12-17 DIAGNOSIS — F419 Anxiety disorder, unspecified: Secondary | ICD-10-CM | POA: Insufficient documentation

## 2014-12-17 DIAGNOSIS — K219 Gastro-esophageal reflux disease without esophagitis: Secondary | ICD-10-CM | POA: Insufficient documentation

## 2014-12-17 DIAGNOSIS — N529 Male erectile dysfunction, unspecified: Secondary | ICD-10-CM | POA: Insufficient documentation

## 2015-01-19 ENCOUNTER — Encounter: Payer: Self-pay | Admitting: Family Medicine

## 2015-02-22 ENCOUNTER — Encounter: Payer: Self-pay | Admitting: Family Medicine

## 2015-02-22 ENCOUNTER — Ambulatory Visit (INDEPENDENT_AMBULATORY_CARE_PROVIDER_SITE_OTHER): Payer: BLUE CROSS/BLUE SHIELD | Admitting: Family Medicine

## 2015-02-22 VITALS — BP 128/72 | Temp 98.7°F | Resp 16 | Ht 68.0 in | Wt 176.0 lb

## 2015-02-22 DIAGNOSIS — F329 Major depressive disorder, single episode, unspecified: Secondary | ICD-10-CM | POA: Diagnosis not present

## 2015-02-22 DIAGNOSIS — M62838 Other muscle spasm: Secondary | ICD-10-CM | POA: Diagnosis not present

## 2015-02-22 DIAGNOSIS — Z23 Encounter for immunization: Secondary | ICD-10-CM

## 2015-02-22 DIAGNOSIS — Z1211 Encounter for screening for malignant neoplasm of colon: Secondary | ICD-10-CM | POA: Diagnosis not present

## 2015-02-22 DIAGNOSIS — K2100 Gastro-esophageal reflux disease with esophagitis, without bleeding: Secondary | ICD-10-CM

## 2015-02-22 DIAGNOSIS — F32A Depression, unspecified: Secondary | ICD-10-CM

## 2015-02-22 DIAGNOSIS — F419 Anxiety disorder, unspecified: Secondary | ICD-10-CM

## 2015-02-22 DIAGNOSIS — Z0000 Encounter for general adult medical examination without abnormal findings: Secondary | ICD-10-CM | POA: Diagnosis not present

## 2015-02-22 DIAGNOSIS — Z125 Encounter for screening for malignant neoplasm of prostate: Secondary | ICD-10-CM

## 2015-02-22 LAB — POCT URINALYSIS DIPSTICK
Bilirubin, UA: NEGATIVE
Blood, UA: NEGATIVE
GLUCOSE UA: NEGATIVE
Ketones, UA: NEGATIVE
LEUKOCYTES UA: NEGATIVE
Nitrite, UA: NEGATIVE
PROTEIN UA: NEGATIVE
SPEC GRAV UA: 1.02
UROBILINOGEN UA: NEGATIVE
pH, UA: 6.5

## 2015-02-22 LAB — IFOBT (OCCULT BLOOD): IFOBT: NEGATIVE

## 2015-02-22 MED ORDER — CYCLOBENZAPRINE HCL 10 MG PO TABS: 10.0000 mg | ORAL_TABLET | Freq: Every day | ORAL | Status: DC

## 2015-02-22 MED ORDER — RABEPRAZOLE SODIUM 20 MG PO TBEC: 20.0000 mg | DELAYED_RELEASE_TABLET | Freq: Every day | ORAL | Status: DC

## 2015-02-22 NOTE — Progress Notes (Signed)
Patient ID: Ronald M Swaziland Jr., male   DOB: [birth year], 54 y.o.   MRN: [id number] Patient: Ronald M Swaziland Jr., Male    DOB: 1961-03-21, 54 y.o.   MRN: [id number] Visit Date: 02/22/2015  Today's Provider: Megan Mans, MD   Chief Complaint  Patient presents with  . Annual Exam   Subjective:  Ronald M Swaziland Jr. is a 54 y.o. male who presents today for health maintenance and complete physical. He feels well. He reports exercising occasionaly. He reports he is sleeping well.  Depression  Patient reports that his symptoms are under good control. He reports that he only takes Xanax 1 tablet daily.   Gastrophageal Reflux  Patient reports that he his having breakthrough symptoms while on medication. Patient currently takes Omeprazole  and Ranitidine  daily. Patient reports that his symptoms are not food related. He has them at any time during the day.     Review of Systems  Constitutional: Negative.   HENT: Negative.   Eyes: Negative.   Respiratory: Negative.   Cardiovascular: Negative.   Gastrointestinal: Negative.   Endocrine: Negative.   Genitourinary: Negative.   Musculoskeletal: Negative.   Skin: Negative.   Allergic/Immunologic: Negative.   Neurological: Negative.   Hematological: Negative.   Psychiatric/Behavioral: Negative.     Social History   Social History  . Marital Status: Married    Spouse Name: N/A  . Number of Children: N/A  . Years of Education: N/A   Occupational History  . Not on file.   Social History Main Topics  . Smoking status: Never Smoker   . Smokeless tobacco: Current User    Types: Chew  . Alcohol Use: 1.8 oz/week    1 Glasses of wine, 2 Cans of beer per week  . Drug Use: No  . Sexual Activity: Not on file   Other Topics Concern  . Not on file   Social History Narrative    Patient Active Problem List   Diagnosis Date Noted  . ADD (attention deficit disorder) 12/17/2014  . Anxiety 12/17/2014  . Acid reflux 12/17/2014  . ED  (erectile dysfunction) of organic origin 12/17/2014  . Abdominal hernia 12/17/2014  . Avitaminosis D 12/17/2014  . BPH (benign prostatic hyperplasia) 09/29/2013  . Near syncope 09/29/2013  . Palpitations 11/25/2012  . Fatigue 11/25/2012  . Hyperlipidemia 11/25/2012  . Clinical depression 08/15/2009  . Esophagitis, reflux 12/05/2006  . Tobacco use 12/05/2006    Past Surgical History  Procedure Laterality Date  . Myringotomy    . Nasal septum surgery    . Hernia repair    . Laparoscopy      diverticulitis    His family history includes Arthritis in his father and mother; Cancer in his father, paternal grandfather, and paternal uncle; Congestive Heart Failure in his mother; Heart attack (age of onset: 39) in his father; Heart disease in his father and maternal grandfather.    Outpatient Prescriptions Prior to Visit  Medication Sig Dispense Refill  . ALPRAZolam (XANAX) 0.5 MG tablet Take 0.5 mg by mouth every 8 (eight) hours as needed for anxiety.    Marland Kitchen omeprazole (PRILOSEC) 20 MG capsule Take 20 mg by mouth daily.    . ranitidine (ZANTAC) 300 MG tablet Take 300 mg by mouth at bedtime.    . sertraline (ZOLOFT) 100 MG tablet Take by mouth.    . tadalafil (CIALIS) 20 MG tablet Take by mouth.    . tamsulosin (FLOMAX) 0.4 MG CAPS capsule Take 1 capsule (  0.4 mg total) by mouth daily. 30 capsule 3  . tiZANidine (ZANAFLEX) 4 MG tablet Take by mouth.    Marland Kitchen VITAMIN D, ERGOCALCIFEROL, PO Take 5,000 mg by mouth daily.    Marland Kitchen amphetamine-dextroamphetamine (ADDERALL) 20 MG tablet Take 20 mg by mouth 2 (two) times daily as needed.    . CYCLOBENZAPRINE HCL PO Take 10 mg by mouth daily as needed.     . finasteride (PROSCAR) 5 MG tablet Take 5 mg by mouth daily.    . Probiotic Product (PROBIOTIC DAILY PO) Take by mouth daily.    . propranolol (INDERAL) 20 MG tablet Take 1 tablet (20 mg total) by mouth 3 (three) times daily as needed. (Patient not taking: Reported on 02/22/2015) 90 tablet 6   No  facility-administered medications prior to visit.    Patient Care Team: Maple Hudson., MD as PCP - General (Family Medicine)     Objective:   Vitals:  Filed Vitals:   02/22/15 1417  BP: 128/72  Temp: 98.7 F (37.1 C)  Resp: 16  Height: 5\' 8"  (1.727 m)  Weight: 176 lb (79.833 kg)    Physical Exam  Constitutional: He is oriented to person, place, and time. He appears well-developed and well-nourished.  HENT:  Head: Normocephalic and atraumatic.  Left Ear: External ear normal.  Nose: Nose normal.  Mouth/Throat: Oropharynx is clear and moist.  Eyes: Conjunctivae and EOM are normal. Pupils are equal, round, and reactive to light.  Neck: Neck supple.  Cardiovascular: Normal rate, regular rhythm, normal heart sounds and intact distal pulses.   Pulmonary/Chest: Effort normal and breath sounds normal.  Abdominal: Soft. Bowel sounds are normal.  Genitourinary: Rectum normal, prostate normal and penis normal.  Musculoskeletal: Normal range of motion.  Neurological: He is alert and oriented to person, place, and time.  Skin: Skin is warm and dry.  Psychiatric: He has a normal mood and affect. His behavior is normal. Judgment and thought content normal.        Assessment & Plan:     Routine Health Maintenance and Physical Exam   Immunization History  Administered Date(s) Administered  . Tdap 07/25/2010    Health Maintenance  Topic Date Due  . Hepatitis C Screening  1960/10/06  . HIV Screening  01/28/1975  . COLONOSCOPY  01/27/2010  . INFLUENZA VACCINE  01/17/2015  . TETANUS/TDAP  07/25/2020      Discussed health benefits of physical activity, and encouraged him to engage in regular exercise appropriate for his age and condition.    GERD  Controlled with generic AcipHex.    Adult ADD  Followed by psychiatry who is treating this.    Chronic depression and anxiety.    Patient states that the Zoloft actually helps him feel the best. He would like to  regular refill on this.     A says that he will probably on this for the rest of his life and I agree. He is not suicidal or homicidal. He states addition to a better mood he has much less agitated and has fewer issues with his temper. ------------------------------------------------------------------------------------------------------------

## 2015-03-11 LAB — COMPREHENSIVE METABOLIC PANEL
ALBUMIN: 4.3 g/dL (ref 3.5–5.5)
ALK PHOS: 63 IU/L (ref 39–117)
ALT: 28 IU/L (ref 0–44)
AST: 24 IU/L (ref 0–40)
Albumin/Globulin Ratio: 1.8 (ref 1.1–2.5)
BUN / CREAT RATIO: 9 (ref 9–20)
BUN: 11 mg/dL (ref 6–24)
Bilirubin Total: 0.2 mg/dL (ref 0.0–1.2)
CO2: 24 mmol/L (ref 18–29)
CREATININE: 1.17 mg/dL (ref 0.76–1.27)
Calcium: 9.1 mg/dL (ref 8.7–10.2)
Chloride: 102 mmol/L (ref 97–108)
GFR calc Af Amer: 81 mL/min/{1.73_m2} (ref >59)
GFR, EST NON AFRICAN AMERICAN: 70 mL/min/{1.73_m2} (ref >59)
GLOBULIN, TOTAL: 2.4 g/dL (ref 1.5–4.5)
Glucose: 100 mg/dL — ABNORMAL HIGH (ref 65–99)
Potassium: 4.5 mmol/L (ref 3.5–5.2)
SODIUM: 140 mmol/L (ref 134–144)
Total Protein: 6.7 g/dL (ref 6.0–8.5)

## 2015-03-11 LAB — LIPID PANEL WITH LDL/HDL RATIO
CHOLESTEROL TOTAL: 219 mg/dL — AB (ref 100–199)
HDL: 57 mg/dL (ref >39)
LDL Calculated: 136 mg/dL — ABNORMAL HIGH (ref 0–99)
LDl/HDL Ratio: 2.4 ratio units (ref 0.0–3.6)
TRIGLYCERIDES: 128 mg/dL (ref 0–149)
VLDL Cholesterol Cal: 26 mg/dL (ref 5–40)

## 2015-03-11 LAB — CBC WITH DIFFERENTIAL/PLATELET
BASOS: 1 %
Basophils Absolute: 0.1 10*3/uL (ref 0.0–0.2)
EOS (ABSOLUTE): 0.4 10*3/uL (ref 0.0–0.4)
EOS: 8 %
HEMATOCRIT: 45.5 % (ref 37.5–51.0)
HEMOGLOBIN: 15.5 g/dL (ref 12.6–17.7)
Immature Grans (Abs): 0 10*3/uL (ref 0.0–0.1)
Immature Granulocytes: 0 %
LYMPHS ABS: 1.7 10*3/uL (ref 0.7–3.1)
Lymphs: 34 %
MCH: 31 pg (ref 26.6–33.0)
MCHC: 34.1 g/dL (ref 31.5–35.7)
MCV: 91 fL (ref 79–97)
MONOCYTES: 10 %
Monocytes Absolute: 0.5 10*3/uL (ref 0.1–0.9)
NEUTROS ABS: 2.3 10*3/uL (ref 1.4–7.0)
Neutrophils: 47 %
Platelets: 246 10*3/uL (ref 150–379)
RBC: 5 x10E6/uL (ref 4.14–5.80)
RDW: 13.9 % (ref 12.3–15.4)
WBC: 5 10*3/uL (ref 3.4–10.8)

## 2015-03-11 LAB — PSA: PROSTATE SPECIFIC AG, SERUM: 2.1 ng/mL (ref 0.0–4.0)

## 2015-03-11 LAB — TSH: TSH: 1.91 u[IU]/mL (ref 0.450–4.500)

## 2015-03-14 ENCOUNTER — Telehealth: Payer: Self-pay

## 2015-03-14 NOTE — Telephone Encounter (Signed)
LMTCB ED 

## 2015-03-14 NOTE — Telephone Encounter (Signed)
-----   Message from Maple Hudson., MD sent at 03/14/2015  8:18 AM EDT ----- Labs within normal limits.

## 2015-03-16 NOTE — Telephone Encounter (Signed)
[email or web address] 

## 2015-04-26 ENCOUNTER — Other Ambulatory Visit: Payer: Self-pay

## 2015-04-26 MED ORDER — SERTRALINE HCL 100 MG PO TABS: 100.0000 mg | ORAL_TABLET | Freq: Every day | ORAL | Status: DC

## 2015-05-17 ENCOUNTER — Other Ambulatory Visit: Payer: Self-pay | Admitting: Family Medicine

## 2015-05-18 ENCOUNTER — Other Ambulatory Visit: Payer: Self-pay

## 2015-05-18 NOTE — Telephone Encounter (Signed)
[facility], order pulled down. Please review-aa

## 2015-05-19 MED ORDER — ALPRAZOLAM 0.5 MG PO TABS: 0.5000 mg | ORAL_TABLET | Freq: Three times a day (TID) | ORAL | Status: DC | PRN

## 2015-08-22 ENCOUNTER — Encounter: Payer: Self-pay | Admitting: Family Medicine

## 2015-08-22 ENCOUNTER — Ambulatory Visit (INDEPENDENT_AMBULATORY_CARE_PROVIDER_SITE_OTHER): Payer: BLUE CROSS/BLUE SHIELD | Admitting: Family Medicine

## 2015-08-22 VITALS — BP 120/82 | HR 76 | Temp 98.8°F | Resp 16 | Wt 181.0 lb

## 2015-08-22 DIAGNOSIS — F988 Other specified behavioral and emotional disorders with onset usually occurring in childhood and adolescence: Secondary | ICD-10-CM

## 2015-08-22 DIAGNOSIS — N400 Enlarged prostate without lower urinary tract symptoms: Secondary | ICD-10-CM | POA: Diagnosis not present

## 2015-08-22 DIAGNOSIS — F32A Depression, unspecified: Secondary | ICD-10-CM

## 2015-08-22 DIAGNOSIS — M62838 Other muscle spasm: Secondary | ICD-10-CM

## 2015-08-22 DIAGNOSIS — F419 Anxiety disorder, unspecified: Secondary | ICD-10-CM

## 2015-08-22 DIAGNOSIS — F329 Major depressive disorder, single episode, unspecified: Secondary | ICD-10-CM

## 2015-08-22 DIAGNOSIS — F909 Attention-deficit hyperactivity disorder, unspecified type: Secondary | ICD-10-CM

## 2015-08-22 DIAGNOSIS — K219 Gastro-esophageal reflux disease without esophagitis: Secondary | ICD-10-CM | POA: Diagnosis not present

## 2015-08-22 MED ORDER — CYCLOBENZAPRINE HCL 10 MG PO TABS
10.0000 mg | ORAL_TABLET | Freq: Every day | ORAL | Status: DC
Start: 2015-08-22 — End: 2017-01-31

## 2015-08-22 MED ORDER — SERTRALINE HCL 100 MG PO TABS: 150.0000 mg | ORAL_TABLET | Freq: Every day | ORAL | Status: DC

## 2015-08-22 MED ORDER — TAMSULOSIN HCL 0.4 MG PO CAPS: 0.4000 mg | ORAL_CAPSULE | Freq: Every day | ORAL | Status: DC

## 2015-08-22 NOTE — Progress Notes (Signed)
Patient ID: Ronald M Ronald Jr., male   DOB: 04/09/1961, 55 y.o.   MRN: [id number]    Subjective:  HPI Pt is her for a 6 month follow up of medications for depression, anxiety and ADD. He reports that he has been doing well.  He will need refills on Tamsulosin and flexeril today.  It is very significant in patient's life that HEENT his wife separated as of yesterday. She moved into a small house a short way from where he and the kids are saying. Sharing custody of the kids, he is very hopefully will get back together. He is somewhat depressed but not suicidal or homicidal.   Prior to Admission medications   Medication Sig Start Date End Date Taking? Authorizing Provider  ALPRAZolam Prudy Feeler(XANAX) 0.5 MG tablet Take 1 tablet (0.5 mg total) by mouth every 8 (eight) hours as needed for anxiety. 05/19/15  Yes Tenasia Aull Hulen ShoutsL Inette Doubrava Jr., MD  amphetamine-dextroamphetamine (ADDERALL) 20 MG tablet Take 20 mg by mouth 2 (two) times daily as needed.   Yes Historical Provider, MD  cyclobenzaprine (FLEXERIL) 10 MG tablet Take 1 tablet (10 mg total) by mouth at bedtime. 02/22/15  Yes Maizee Reinhold Hulen ShoutsL Zidan Helget Jr., MD  finasteride (PROSCAR) 5 MG tablet Take 5 mg by mouth daily.   Yes Historical Provider, MD  Probiotic Product (PROBIOTIC DAILY PO) Take by mouth daily.   Yes Historical Provider, MD  RABEprazole (ACIPHEX) 20 MG tablet Take 1 tablet (20 mg total) by mouth daily. 02/22/15  Yes Genine Beckett Hulen ShoutsL Brice Kossman Jr., MD  sertraline (ZOLOFT) 100 MG tablet Take 1 tablet (100 mg total) by mouth daily. 04/26/15  Yes Chesney Suares Hulen ShoutsL Salimata Christenson Jr., MD  tadalafil (CIALIS) 20 MG tablet Take by mouth. 07/17/11  Yes Historical Provider, MD  tamsulosin (FLOMAX) 0.4 MG CAPS capsule Take 1 capsule (0.4 mg total) by mouth daily. 05/17/15  Yes Keyandre Pileggi Hulen ShoutsL Arneda Sappington Jr., MD  VITAMIN D, ERGOCALCIFEROL, PO Take 5,000 mg by mouth daily.   Yes Historical Provider, MD  propranolol (INDERAL) 20 MG tablet Take 1 tablet (20 mg total) by mouth 3 (three) times daily as  needed. Patient not taking: Reported on 02/22/2015 11/25/12   Antonieta Ibaimothy J Gollan, MD    Patient Active Problem List   Diagnosis Date Noted  . ADD (attention deficit disorder) 12/17/2014  . Anxiety 12/17/2014  . Acid reflux 12/17/2014  . ED (erectile dysfunction) of organic origin 12/17/2014  . Abdominal hernia 12/17/2014  . Avitaminosis D 12/17/2014  . BPH (benign prostatic hyperplasia) 09/29/2013  . Near syncope 09/29/2013  . Palpitations 11/25/2012  . Fatigue 11/25/2012  . Hyperlipidemia 11/25/2012  . Clinical depression 08/15/2009  . Esophagitis, reflux 12/05/2006  . Tobacco use 12/05/2006    Past Medical History  Diagnosis Date  . Vitamin D deficiency   . Hypercholesterolemia   . ED (erectile dysfunction)   . Numbness of anterior thigh   . Meralgia paraesthetica   . H/O measles   . Tension headache   . Enthesopathy   . GERD (gastroesophageal reflux disease)   . Sinusitis   . Depressive disorder   . Pharyngitis, acute   . History of dysuria     Social History   Social History  . Marital Status: Married    Spouse Name: N/A  . Number of Children: N/A  . Years of Education: N/A   Occupational History  . Not on file.   Social History Main Topics  . Smoking status: Never Smoker   . Smokeless tobacco: Current User  Types: Chew  . Alcohol Use: 1.8 oz/week    1 Glasses of wine, 2 Cans of beer per week  . Drug Use: No  . Sexual Activity: Not on file   Other Topics Concern  . Not on file   Social History Narrative    Allergies  Allergen Reactions  . Venlafaxine     panic atack/anxiety/"flu like" symptoms-severe    Review of Systems  Constitutional: Negative.   HENT: Negative.   Eyes: Negative.   Respiratory: Negative.   Cardiovascular: Negative.   Gastrointestinal: Negative.   Genitourinary: Negative.   Musculoskeletal: Negative.   Skin: Negative.   Neurological: Negative.   Endo/Heme/Allergies: Negative.   Psychiatric/Behavioral: Negative.      Immunization History  Administered Date(s) Administered  . Influenza,inj,Quad PF,36+ Mos 02/22/2015  . Tdap 07/25/2010   Objective:  BP 120/82 mmHg  Pulse 76  Temp(Src) 98.8 F (37.1 C) (Oral)  Resp 16  Wt 181 lb (82.101 kg)  Physical Exam  Constitutional: He is oriented to person, place, and time and well-developed, well-nourished, and in no distress.  Eyes: Conjunctivae and EOM are normal. Pupils are equal, round, and reactive to light.  Neck: Normal range of motion. Neck supple.  Cardiovascular: Normal rate, regular rhythm, normal heart sounds and intact distal pulses.   Pulmonary/Chest: Effort normal and breath sounds normal.  Musculoskeletal: Normal range of motion.  Neurological: He is alert and oriented to person, place, and time. He has normal reflexes. Gait normal. GCS score is 15.  Skin: Skin is warm and dry.  Psychiatric: Mood, memory, affect and judgment normal.    Lab Results  Component Value Date   WBC 5.0 03/10/2015   HGB 16.4 10/13/2013   HCT 45.5 03/10/2015   PLT 246 03/10/2015   GLUCOSE 100* 03/10/2015   CHOL 219* 03/10/2015   TRIG 128 03/10/2015   HDL 57 03/10/2015   LDLCALC 136* 03/10/2015   TSH 1.910 03/10/2015   PSA 1.5 10/13/2013    CMP     Component Value Date/Time   NA 140 03/10/2015 0807   K 4.5 03/10/2015 0807   CL 102 03/10/2015 0807   CO2 24 03/10/2015 0807   GLUCOSE 100* 03/10/2015 0807   BUN 11 03/10/2015 0807   CREATININE 1.17 03/10/2015 0807   CREATININE 1.3 10/13/2013   CALCIUM 9.1 03/10/2015 0807   PROT 6.7 03/10/2015 0807   ALBUMIN 4.3 03/10/2015 0807   AST 24 03/10/2015 0807   ALT 28 03/10/2015 0807   ALKPHOS 63 03/10/2015 0807   BILITOT 0.2 03/10/2015 0807   GFRNONAA 70 03/10/2015 0807   GFRAA 81 03/10/2015 0807    Assessment and Plan :  1. Clinical depression Increase Sertraline 100 mg to 1 and a half tab of Sertraline. Follow up in 2 months. 2. Anxiety More than 50% of visit spent in counseling.  3. ADD  (attention deficit disorder)   4. Gastroesophageal reflux disease, esophagitis presence not specified   5. Muscle spasm  - cyclobenzaprine (FLEXERIL) 10 MG tablet; Take 1 tablet (10 mg total) by mouth at bedtime.  Dispense: 30 tablet; Refill: 12  6. BPH (benign prostatic hyperplasia)  - tamsulosin (FLOMAX) 0.4 MG CAPS capsule; Take 1 capsule (0.4 mg total) by mouth daily.  Dispense: 30 capsule; Refill: 12  Patient was seen and examined by Dr. Julieanne Manson, and noted scribed by Dimas Chyle, CMA  Julieanne Manson MD [facility] 08/22/2015 1:34 PM

## 2015-10-24 ENCOUNTER — Ambulatory Visit (INDEPENDENT_AMBULATORY_CARE_PROVIDER_SITE_OTHER): Payer: BLUE CROSS/BLUE SHIELD | Admitting: Family Medicine

## 2015-10-24 VITALS — BP 132/74 | HR 78 | Temp 98.4°F | Resp 16 | Wt 179.0 lb

## 2015-10-24 DIAGNOSIS — F329 Major depressive disorder, single episode, unspecified: Secondary | ICD-10-CM

## 2015-10-24 DIAGNOSIS — F32A Depression, unspecified: Secondary | ICD-10-CM

## 2015-10-24 NOTE — Progress Notes (Signed)
Patient ID: Ronald M SwazilandJordan Jr., male   DOB: [birth year], 55 y.o.   MRN: [id number]   Ronald M SwazilandJordan Jr.  MRN: [id number] DOB: [birth year]  Subjective:  HPI   The patient is a 55 year old male who presents for follow up of his depression.  He was last seen  On 08/22/15.  At that time he was instructed to increase his Sertraline from 100 mg to 150 mg total in a day.  He takes 100 mg at night and 50 in the morning.  He states he feels he is doing well on this current dose. He and his wife are separated but she lives in a house that is attached to his family's property. It is about 50 yards from his house. He has the children Monday through Thursday and his wife has them Friday through Sunday. He does not want to be separated but he is adjusting to it. His wife  is  to start seeing Dr. Maryruth BunKapur from psychiatry soon. Patient Active Problem List   Diagnosis Date Noted  . ADD (attention deficit disorder) 12/17/2014  . Anxiety 12/17/2014  . Acid reflux 12/17/2014  . ED (erectile dysfunction) of organic origin 12/17/2014  . Abdominal hernia 12/17/2014  . Avitaminosis D 12/17/2014  . BPH (benign prostatic hyperplasia) 09/29/2013  . Near syncope 09/29/2013  . Palpitations 11/25/2012  . Fatigue 11/25/2012  . Hyperlipidemia 11/25/2012  . Clinical depression 08/15/2009  . Esophagitis, reflux 12/05/2006  . Tobacco use 12/05/2006    Past Medical History  Diagnosis Date  . Vitamin D deficiency   . Hypercholesterolemia   . ED (erectile dysfunction)   . Numbness of anterior thigh   . Meralgia paraesthetica   . H/O measles   . Tension headache   . Enthesopathy   . GERD (gastroesophageal reflux disease)   . Sinusitis   . Depressive disorder   . Pharyngitis, acute   . History of dysuria     Social History   Social History  . Marital Status: Married    Spouse Name: N/A  . Number of Children: N/A  . Years of Education: N/A   Occupational History  . Not on file.   Social History Main Topics  .  Smoking status: Never Smoker   . Smokeless tobacco: Current User    Types: Chew  . Alcohol Use: 1.8 oz/week    1 Glasses of wine, 2 Cans of beer per week  . Drug Use: No  . Sexual Activity: Not on file   Other Topics Concern  . Not on file   Social History Narrative    Outpatient Prescriptions Prior to Visit  Medication Sig Dispense Refill  . ALPRAZolam (XANAX) 0.5 MG tablet Take 1 tablet (0.5 mg total) by mouth every 8 (eight) hours as needed for anxiety. 90 tablet 5  . amphetamine-dextroamphetamine (ADDERALL) 20 MG tablet Take 20 mg by mouth 2 (two) times daily as needed.    . cyclobenzaprine (FLEXERIL) 10 MG tablet Take 1 tablet (10 mg total) by mouth at bedtime. 30 tablet 12  . Probiotic Product (PROBIOTIC DAILY PO) Take by mouth daily.    . RABEprazole (ACIPHEX) 20 MG tablet Take 1 tablet (20 mg total) by mouth daily. 30 tablet 12  . sertraline (ZOLOFT) 100 MG tablet Take 1.5 tablets (150 mg total) by mouth daily. 1 and a half tablets daily 45 tablet 12  . tadalafil (CIALIS) 20 MG tablet Take by mouth.    . tamsulosin (FLOMAX) 0.4 MG CAPS  capsule Take 1 capsule (0.4 mg total) by mouth daily. 30 capsule 12  . VITAMIN D, ERGOCALCIFEROL, PO Take 5,000 mg by mouth daily.    . finasteride (PROSCAR) 5 MG tablet Take 5 mg by mouth daily. Reported on 08/22/2015    . propranolol (INDERAL) 20 MG tablet Take 1 tablet (20 mg total) by mouth 3 (three) times daily as needed. (Patient not taking: Reported on 02/22/2015) 90 tablet 6   No facility-administered medications prior to visit.   Outpatient Encounter Prescriptions as of 10/24/2015  Medication Sig Note  . ALPRAZolam (XANAX) 0.5 MG tablet Take 1 tablet (0.5 mg total) by mouth every 8 (eight) hours as needed for anxiety.   Marland Kitchen amphetamine-dextroamphetamine (ADDERALL) 20 MG tablet Take 20 mg by mouth 2 (two) times daily as needed.   . cyclobenzaprine (FLEXERIL) 10 MG tablet Take 1 tablet (10 mg total) by mouth at bedtime.   . Probiotic Product  (PROBIOTIC DAILY PO) Take by mouth daily.   . RABEprazole (ACIPHEX) 20 MG tablet Take 1 tablet (20 mg total) by mouth daily.   . sertraline (ZOLOFT) 100 MG tablet Take 1.5 tablets (150 mg total) by mouth daily. 1 and a half tablets daily   . tadalafil (CIALIS) 20 MG tablet Take by mouth. 12/17/2014: Medication taken as needed.  Received from: [facility]  . tamsulosin (FLOMAX) 0.4 MG CAPS capsule Take 1 capsule (0.4 mg total) by mouth daily.   Marland Kitchen VITAMIN D, ERGOCALCIFEROL, PO Take 5,000 mg by mouth daily.   . [DISCONTINUED] finasteride (PROSCAR) 5 MG tablet Take 5 mg by mouth daily. Reported on 08/22/2015   . [DISCONTINUED] propranolol (INDERAL) 20 MG tablet Take 1 tablet (20 mg total) by mouth 3 (three) times daily as needed. (Patient not taking: Reported on 02/22/2015)    No facility-administered encounter medications on file as of 10/24/2015.    Allergies  Allergen Reactions  . Venlafaxine     panic atack/anxiety/"flu like" symptoms-severe    Review of Systems  Constitutional: Negative for fever and malaise/fatigue.  Respiratory: Negative for cough, shortness of breath and wheezing.   Cardiovascular: Negative for chest pain, palpitations, orthopnea, claudication and leg swelling.  Neurological: Negative for dizziness, weakness and headaches.  Psychiatric/Behavioral: Negative for depression, suicidal ideas, hallucinations, memory loss and substance abuse. The patient is not nervous/anxious and does not have insomnia.    Objective:  BP 132/74 mmHg  Pulse 78  Temp(Src) 98.4 F (36.9 C) (Oral)  Resp 16  Wt 179 lb (81.194 kg)  Physical Exam  Constitutional: He is oriented to person, place, and time and well-developed, well-nourished, and in no distress.  HENT:  Head: Normocephalic.  Eyes: Conjunctivae are normal. Pupils are equal, round, and reactive to light.  Neck: Normal range of motion. Neck supple.  Cardiovascular: Normal rate, regular rhythm and normal heart  sounds.   Pulmonary/Chest: Effort normal and breath sounds normal.  Musculoskeletal: He exhibits no edema.  Neurological: He is alert and oriented to person, place, and time. Gait normal.  Skin: Skin is warm and dry.  Psychiatric: Memory, affect and judgment normal.    Assessment and Plan :  1. Clinical depression Improved, in remission on larger doses of sertraline. Continue and I'll see him back for CPE in September 2. ADD Patient is limiting his stimulant use 3. Chronic GERD I have done the exam and reviewed the above chart and it is accurate to the best of my knowledge.    Julieanne Manson MD [facility]  [facility] 10/24/2015 3:27 PM

## 2015-12-30 ENCOUNTER — Other Ambulatory Visit: Payer: Self-pay

## 2015-12-30 NOTE — Telephone Encounter (Signed)
[facility]ill [facility]-aa

## 2016-01-02 MED ORDER — ALPRAZOLAM 0.5 MG PO TABS: 0.5000 mg | ORAL_TABLET | Freq: Three times a day (TID) | ORAL | Status: DC | PRN

## 2016-02-15 ENCOUNTER — Telehealth: Payer: Self-pay | Admitting: Family Medicine

## 2016-02-15 NOTE — Telephone Encounter (Signed)
Patient is requesting a refill on sertraline (ZOLOFT) 100 MG tablet   [facility]  Patient has a up coming appt but will be out of meds by then  Patient is requesting to go to two daily

## 2016-02-15 NOTE — Telephone Encounter (Signed)
Please review-aa 

## 2016-02-23 ENCOUNTER — Ambulatory Visit (INDEPENDENT_AMBULATORY_CARE_PROVIDER_SITE_OTHER): Payer: BLUE CROSS/BLUE SHIELD | Admitting: Family Medicine

## 2016-02-23 ENCOUNTER — Encounter: Payer: Self-pay | Admitting: Family Medicine

## 2016-02-23 VITALS — BP 126/78 | HR 88 | Temp 98.8°F | Resp 16 | Ht 68.0 in | Wt 178.0 lb

## 2016-02-23 DIAGNOSIS — Z0000 Encounter for general adult medical examination without abnormal findings: Secondary | ICD-10-CM | POA: Diagnosis not present

## 2016-02-23 DIAGNOSIS — Z1211 Encounter for screening for malignant neoplasm of colon: Secondary | ICD-10-CM

## 2016-02-23 DIAGNOSIS — N400 Enlarged prostate without lower urinary tract symptoms: Secondary | ICD-10-CM

## 2016-02-23 DIAGNOSIS — R739 Hyperglycemia, unspecified: Secondary | ICD-10-CM

## 2016-02-23 DIAGNOSIS — F329 Major depressive disorder, single episode, unspecified: Secondary | ICD-10-CM

## 2016-02-23 DIAGNOSIS — F32A Depression, unspecified: Secondary | ICD-10-CM

## 2016-02-23 LAB — POCT URINALYSIS DIPSTICK
BILIRUBIN UA: NEGATIVE
Blood, UA: NEGATIVE
GLUCOSE UA: NEGATIVE
KETONES UA: NEGATIVE
Leukocytes, UA: NEGATIVE
Nitrite, UA: NEGATIVE
Protein, UA: NEGATIVE
SPEC GRAV UA: 1.01
Urobilinogen, UA: 0.2
pH, UA: 7

## 2016-02-23 LAB — IFOBT (OCCULT BLOOD): IFOBT: NEGATIVE

## 2016-02-23 MED ORDER — SERTRALINE HCL 100 MG PO TABS
200.0000 mg | ORAL_TABLET | Freq: Every day | ORAL | 12 refills | Status: DC
Start: 2016-02-23 — End: 2017-03-12

## 2016-02-23 NOTE — Progress Notes (Addendum)
Patient: Ronald M SwazilandJordan Jr., Male    DOB: Dec 09, 1960, 55 y.o.   MRN: [id number] Visit Date: 02/23/2016  Today's Provider: Megan Mansichard Gilbert Jr, MD   Chief Complaint  Patient presents with  . Annual Exam   Subjective:    Annual physical exam Ronald M SwazilandJordan Jr. is a 55 y.o. male who presents today for health maintenance and complete physical. He feels fairly well. He reports he is not exercising. He reports he is sleeping fairly well.  ----------------------------------------------------------------- Colonoscopy- 08/22/06 1 Polyp, hemorrhoids otherwise normal  Immunization History  Administered Date(s) Administered  . Influenza,inj,Quad PF,36+ Mos 02/22/2015  . Tdap 07/25/2010   Depression- Pt reports that he is not feeling well emotionally. He and his wife are separating and he is having a hard time dealing with it. He has increased his sertraline to 100 mg twice a day.   Review of Systems  Constitutional: Negative.   HENT: Negative.   Eyes: Negative.   Respiratory: Negative.   Cardiovascular: Negative.   Gastrointestinal: Negative.   Endocrine: Negative.   Genitourinary: Negative.   Musculoskeletal: Positive for back pain, neck pain and neck stiffness.  Allergic/Immunologic: Negative.   Neurological: Negative.   Hematological: Negative.   Psychiatric/Behavioral: Negative.     Social History      He  reports that he has never smoked. His smokeless tobacco use includes Chew. He reports that he drinks about 1.8 oz of alcohol per week . He reports that he does not use drugs.       Social History   Social History  . Marital status: Married    Spouse name: N/A  . Number of children: N/A  . Years of education: N/A   Social History Main Topics  . Smoking status: Never Smoker  . Smokeless tobacco: Current User    Types: Chew  . Alcohol use 1.8 oz/week    1 Glasses of wine, 2 Cans of beer per week  . Drug use: No  . Sexual activity: Not Asked   Other Topics  Concern  . None   Social History Narrative  . None    Past Medical History:  Diagnosis Date  . Depressive disorder   . ED (erectile dysfunction)   . Enthesopathy   . GERD (gastroesophageal reflux disease)   . H/O measles   . History of dysuria   . Hypercholesterolemia   . Meralgia paraesthetica   . Numbness of anterior thigh   . Pharyngitis, acute   . Sinusitis   . Tension headache   . Vitamin D deficiency      Patient Active Problem List   Diagnosis Date Noted  . ADD (attention deficit disorder) 12/17/2014  . Anxiety 12/17/2014  . Acid reflux 12/17/2014  . ED (erectile dysfunction) of organic origin 12/17/2014  . Abdominal hernia 12/17/2014  . Avitaminosis D 12/17/2014  . BPH (benign prostatic hyperplasia) 09/29/2013  . Near syncope 09/29/2013  . Palpitations 11/25/2012  . Fatigue 11/25/2012  . Hyperlipidemia 11/25/2012  . Clinical depression 08/15/2009  . Esophagitis, reflux 12/05/2006  . Tobacco use 12/05/2006    Past Surgical History:  Procedure Laterality Date  . HERNIA REPAIR    . LAPAROSCOPY     diverticulitis  . MYRINGOTOMY    . NASAL SEPTUM SURGERY      Family History        Family Status  Relation Status  . Father Alive  . Mother Alive   CHF  . Sister Alive  .  Brother Alive  . Daughter Alive  . Son Alive  . Son Alive  . Sister Alive  . Paternal Uncle   . Maternal Grandfather   . Paternal Grandfather         His family history includes Arthritis in his father and mother; Cancer in his father, paternal grandfather, and paternal uncle; Congestive Heart Failure in his mother; Heart attack (age of onset: 37) in his father; Heart disease in his father and maternal grandfather.    Allergies  Allergen Reactions  . Venlafaxine     panic atack/anxiety/"flu like" symptoms-severe    No outpatient prescriptions have been marked as taking for the 02/23/16 encounter (Office Visit) with Maple Hudson., MD.    Patient Care Team: Maple Hudson., MD as PCP - General (Family Medicine)     Objective:   Vitals: There were no vitals taken for this visit.   Physical Exam  Constitutional: He is oriented to person, place, and time. He appears well-developed and well-nourished.  HENT:  Head: Normocephalic and atraumatic.  Right Ear: External ear normal.  Left Ear: External ear normal.  Nose: Nose normal.  Mouth/Throat: Oropharynx is clear and moist.  Right myringotomy tube in place  Eyes: Conjunctivae are normal. No scleral icterus.  Neck: Neck supple. No thyromegaly present.  Cardiovascular: Normal rate, regular rhythm, normal heart sounds and intact distal pulses.   Pulmonary/Chest: Effort normal and breath sounds normal.  Abdominal: Soft.  Genitourinary: Prostate normal and penis normal.  Musculoskeletal: Normal range of motion.  Lymphadenopathy:    He has no cervical adenopathy.  Neurological: He is alert and oriented to person, place, and time. No cranial nerve deficit. He exhibits normal muscle tone. Coordination normal.  Skin: Skin is warm and dry.  Psychiatric: He has a normal mood and affect. His behavior is normal. Thought content normal.     Depression Screen No flowsheet data found.    Assessment & Plan:     Routine Health Maintenance and Physical Exam  Exercise Activities and Dietary recommendations Goals    None      Immunization History  Administered Date(s) Administered  . Influenza,inj,Quad PF,36+ Mos 02/22/2015  . Tdap 07/25/2010    Health Maintenance  Topic Date Due  . Hepatitis C Screening  08-26-1960  . HIV Screening  01/28/1975  . COLONOSCOPY  01/27/2010  . INFLUENZA VACCINE  01/17/2016  . TETANUS/TDAP  07/25/2020   1. Annual physical exam  - CBC with Differential/Platelet - Lipid Panel With LDL/HDL Ratio - TSH - POCT urinalysis dipstick - Comprehensive metabolic panel  2. Clinical depression Patient struggling as he is getting ready to sign legal separation  papers with his wife. At this time we'll increase Zoloft from 150-200 mg daily. Return to clinic 1-2 months. Patient not suicidal or homicidal. - sertraline (ZOLOFT) 100 MG tablet; Take 2 tablets (200 mg total) by mouth daily.  Dispense: 60 tablet; Refill: 12  3. BPH (benign prostatic hyperplasia)  - PSA  4. Hyperglycemia  - Hemoglobin A1c   I have done the exam and reviewed the above chart and it is accurate to the best of my knowledge.  Discussed health benefits of physical activity, and encouraged him to engage in regular exercise appropriate for his age and condition.    --------------------------------------------------------------------    Megan Mans, MD  [facility]

## 2016-02-24 LAB — COMPREHENSIVE METABOLIC PANEL
A/G RATIO: 1.8 (ref 1.2–2.2)
ALT: 23 IU/L (ref 0–44)
AST: 25 IU/L (ref 0–40)
Albumin: 4.7 g/dL (ref 3.5–5.5)
Alkaline Phosphatase: 66 IU/L (ref 39–117)
BUN / CREAT RATIO: 11 (ref 9–20)
BUN: 12 mg/dL (ref 6–24)
Bilirubin Total: 0.4 mg/dL (ref 0.0–1.2)
CALCIUM: 9.2 mg/dL (ref 8.7–10.2)
CO2: 26 mmol/L (ref 18–29)
Chloride: 100 mmol/L (ref 96–106)
Creatinine, Ser: 1.11 mg/dL (ref 0.76–1.27)
GFR, EST AFRICAN AMERICAN: 85 mL/min/{1.73_m2} (ref >59)
GFR, EST NON AFRICAN AMERICAN: 74 mL/min/{1.73_m2} (ref >59)
GLOBULIN, TOTAL: 2.6 g/dL (ref 1.5–4.5)
Glucose: 96 mg/dL (ref 65–99)
POTASSIUM: 4.4 mmol/L (ref 3.5–5.2)
SODIUM: 140 mmol/L (ref 134–144)
TOTAL PROTEIN: 7.3 g/dL (ref 6.0–8.5)

## 2016-02-24 LAB — CBC WITH DIFFERENTIAL/PLATELET
BASOS ABS: 0 10*3/uL (ref 0.0–0.2)
BASOS: 1 %
EOS (ABSOLUTE): 0.1 10*3/uL (ref 0.0–0.4)
Eos: 3 %
Hematocrit: 45.9 % (ref 37.5–51.0)
Hemoglobin: 15.9 g/dL (ref 12.6–17.7)
IMMATURE GRANULOCYTES: 1 %
Immature Grans (Abs): 0 10*3/uL (ref 0.0–0.1)
Lymphocytes Absolute: 1.3 10*3/uL (ref 0.7–3.1)
Lymphs: 29 %
MCH: 30.8 pg (ref 26.6–33.0)
MCHC: 34.6 g/dL (ref 31.5–35.7)
MCV: 89 fL (ref 79–97)
MONOS ABS: 0.4 10*3/uL (ref 0.1–0.9)
Monocytes: 9 %
NEUTROS ABS: 2.6 10*3/uL (ref 1.4–7.0)
NEUTROS PCT: 57 %
PLATELETS: 228 10*3/uL (ref 150–379)
RBC: 5.17 x10E6/uL (ref 4.14–5.80)
RDW: 13.6 % (ref 12.3–15.4)
WBC: 4.4 10*3/uL (ref 3.4–10.8)

## 2016-02-24 LAB — LIPID PANEL WITH LDL/HDL RATIO
Cholesterol, Total: 249 mg/dL — ABNORMAL HIGH (ref 100–199)
HDL: 57 mg/dL (ref >39)
LDL CALC: 163 mg/dL — AB (ref 0–99)
LDL/HDL RATIO: 2.9 ratio (ref 0.0–3.6)
Triglycerides: 144 mg/dL (ref 0–149)
VLDL Cholesterol Cal: 29 mg/dL (ref 5–40)

## 2016-02-24 LAB — TSH: TSH: 1.12 u[IU]/mL (ref 0.450–4.500)

## 2016-02-24 LAB — HEMOGLOBIN A1C
Est. average glucose Bld gHb Est-mCnc: 111 mg/dL
Hgb A1c MFr Bld: 5.5 % (ref 4.8–5.6)

## 2016-02-24 LAB — PSA: Prostate Specific Ag, Serum: 2.5 ng/mL (ref 0.0–4.0)

## 2016-02-25 ENCOUNTER — Telehealth: Payer: Self-pay

## 2016-02-25 NOTE — Telephone Encounter (Signed)
-----   Message from Maple Hudsonichard L Gilbert Jr., MD sent at 02/25/2016  8:15 AM EDT ----- Labs okay.

## 2016-02-25 NOTE — Telephone Encounter (Signed)
LMTCB 02/25/16  Thanks,  -Joseline

## 2016-02-27 NOTE — Telephone Encounter (Signed)
Pt advised-aa 

## 2016-04-12 ENCOUNTER — Other Ambulatory Visit: Payer: Self-pay | Admitting: Family Medicine

## 2016-04-12 DIAGNOSIS — K2100 Gastro-esophageal reflux disease with esophagitis, without bleeding: Secondary | ICD-10-CM

## 2016-05-18 ENCOUNTER — Other Ambulatory Visit: Payer: Self-pay

## 2016-05-18 NOTE — Telephone Encounter (Signed)
Please review, refill request from pharmacy-=aa

## 2016-05-21 MED ORDER — ALPRAZOLAM 0.5 MG PO TABS: 0.5000 mg | ORAL_TABLET | Freq: Three times a day (TID) | ORAL | 2 refills | Status: DC | PRN

## 2016-10-22 ENCOUNTER — Other Ambulatory Visit: Payer: Self-pay | Admitting: Family Medicine

## 2016-10-22 NOTE — Telephone Encounter (Signed)
[facility] a request on the following medication. Thanks CC  ALPRAZolam (XANAX) 0.5 MG tablet  Take 1 tablet every 8 hours as needed for anxiety.

## 2016-10-23 MED ORDER — ALPRAZOLAM 0.5 MG PO TABS: 0.5000 mg | ORAL_TABLET | Freq: Three times a day (TID) | ORAL | 0 refills | Status: DC | PRN

## 2016-10-24 ENCOUNTER — Other Ambulatory Visit: Payer: Self-pay | Admitting: Family Medicine

## 2016-10-24 DIAGNOSIS — N400 Enlarged prostate without lower urinary tract symptoms: Secondary | ICD-10-CM

## 2016-12-28 ENCOUNTER — Other Ambulatory Visit: Payer: Self-pay | Admitting: Family Medicine

## 2016-12-28 DIAGNOSIS — N400 Enlarged prostate without lower urinary tract symptoms: Secondary | ICD-10-CM

## 2016-12-28 MED ORDER — ALPRAZOLAM 0.5 MG PO TABS: 0.5000 mg | ORAL_TABLET | Freq: Three times a day (TID) | ORAL | 0 refills | Status: DC | PRN

## 2016-12-28 NOTE — Telephone Encounter (Signed)
[facility] request on the following medication. Thanks CC  ALPRAZolam (XANAX) 0.5 MG tablet  >Take 1 (one) tablet every 8 hours as needed for anxiety.

## 2017-01-31 ENCOUNTER — Other Ambulatory Visit: Payer: Self-pay | Admitting: Family Medicine

## 2017-01-31 DIAGNOSIS — M62838 Other muscle spasm: Secondary | ICD-10-CM

## 2017-02-25 ENCOUNTER — Other Ambulatory Visit: Payer: Self-pay | Admitting: Family Medicine

## 2017-02-25 NOTE — Telephone Encounter (Signed)
[facility]ill [facility] on the following medications:  ALPRAZolam (XANAX) 0.5 MG tablet.  Take one tablet every eight hours as needed for anxiety.  90 day supply.    [facility]

## 2017-02-26 MED ORDER — ALPRAZOLAM 0.5 MG PO TABS: 0.5000 mg | ORAL_TABLET | Freq: Three times a day (TID) | ORAL | 0 refills | Status: DC | PRN

## 2017-03-05 ENCOUNTER — Ambulatory Visit (INDEPENDENT_AMBULATORY_CARE_PROVIDER_SITE_OTHER): Payer: BLUE CROSS/BLUE SHIELD | Admitting: Family Medicine

## 2017-03-05 VITALS — BP 114/62 | HR 72 | Temp 98.0°F | Resp 16 | Wt 192.0 lb

## 2017-03-05 DIAGNOSIS — S46111A Strain of muscle, fascia and tendon of long head of biceps, right arm, initial encounter: Secondary | ICD-10-CM | POA: Diagnosis not present

## 2017-03-05 DIAGNOSIS — F3289 Other specified depressive episodes: Secondary | ICD-10-CM | POA: Diagnosis not present

## 2017-03-05 NOTE — Progress Notes (Signed)
Ronald M Pree Jr.  [id number] DOB: 03-01-1961  Subjective:  HPI  Patient is here to discuss right arm pain. On Friday September 14th he was moving ply wood and felt something pop in the right arm, forearm tighten up right after. He kept his arm in a sling for about 2 to 3 days, he has used ice and taking Ibuprofen. Yesterday felt better with the pain but then noticed bruising developed in the forearm and bruising has spread today. Certain arm movements make pain worse, decreased ability of motion with right arm and strength due to the pain. No numbness or tingling sensation present. Patient Active Problem List   Diagnosis Date Noted  . ADD (attention deficit disorder) 12/17/2014  . Anxiety 12/17/2014  . Acid reflux 12/17/2014  . ED (erectile dysfunction) of organic origin 12/17/2014  . Abdominal hernia 12/17/2014  . Avitaminosis D 12/17/2014  . BPH (benign prostatic hyperplasia) 09/29/2013  . Near syncope 09/29/2013  . Palpitations 11/25/2012  . Fatigue 11/25/2012  . Hyperlipidemia 11/25/2012  . Clinical depression 08/15/2009  . Esophagitis, reflux 12/05/2006  . Tobacco use 12/05/2006    Past Medical History:  Diagnosis Date  . Depressive disorder   . ED (erectile dysfunction)   . Enthesopathy   . GERD (gastroesophageal reflux disease)   . H/O measles   . History of dysuria   . Hypercholesterolemia   . Meralgia paraesthetica   . Numbness of anterior thigh   . Pharyngitis, acute   . Sinusitis   . Tension headache   . Vitamin D deficiency     Social History   Social History  . Marital status: Married    Spouse name: N/A  . Number of children: N/A  . Years of education: N/A   Occupational History  . Not on file.   Social History Main Topics  . Smoking status: Never Smoker  . Smokeless tobacco: Current User    Types: Chew  . Alcohol use 1.8 oz/week    1 Glasses of wine, 2 Cans of beer per week  . Drug use: No  . Sexual activity: Not on file   Other  Topics Concern  . Not on file   Social History Narrative  . No narrative on file    Outpatient Encounter Prescriptions as of 03/05/2017  Medication Sig Note  . ALPRAZolam (XANAX) 0.5 MG tablet Take 1 tablet (0.5 mg total) by mouth every 8 (eight) hours as needed for anxiety.   Marland Kitchen amphetamine-dextroamphetamine (ADDERALL) 20 MG tablet Take 20 mg by mouth 2 (two) times daily as needed.   . cyclobenzaprine (FLEXERIL) 10 MG tablet Take 1 tablet (10 mg total) by mouth at bedtime.   . Melatonin 5 MG CAPS Take by mouth.   . Probiotic Product (PROBIOTIC DAILY PO) Take by mouth daily.   . RABEprazole (ACIPHEX) 20 MG tablet Take 1 tablet (20 mg total) by mouth daily.   . sertraline (ZOLOFT) 100 MG tablet Take 2 tablets (200 mg total) by mouth daily.   . tadalafil (CIALIS) 20 MG tablet Take by mouth. 12/17/2014: Medication taken as needed.  Received from: [facility]  . tamsulosin (FLOMAX) 0.4 MG CAPS capsule Take 1 capsule (0.4 mg total) by mouth daily.   Marland Kitchen VITAMIN D, ERGOCALCIFEROL, PO Take 5,000 mg by mouth daily.    No facility-administered encounter medications on file as of 03/05/2017.     Allergies  Allergen Reactions  . Venlafaxine     panic atack/anxiety/"flu like" symptoms-severe  Review of Systems  Constitutional: Negative.   Respiratory: Negative.   Cardiovascular: Negative.   Musculoskeletal: Positive for joint pain and myalgias.  Skin:       Bruising present    Objective:  BP 114/62   Pulse 72   Temp 98 F (36.7 C)   Resp 16   Wt 192 lb (87.1 kg)   BMI 29.19 kg/m   Physical Exam  Constitutional: He is oriented to person, place, and time and well-developed, well-nourished, and in no distress.  Cardiovascular: Normal rate, regular rhythm, normal heart sounds and intact distal pulses.  Exam reveals no gallop.   No murmur heard. Pulmonary/Chest: Effort normal and breath sounds normal. No respiratory distress. He has no wheezes.  Musculoskeletal:  Mild  swelling in the right proximal to the right antecubital space. Loss of mass in the right bicep consistent with rupture of long head of the bicep. Bruising is present.  Neurological: He is alert and oriented to person, place, and time.   Assessment and Plan :  1. Rupture long head biceps tendon, right, initial encounter Refer to Ortho. Pain is tolerable. Follow as needed. - Ambulatory referral to Orthopedic Surgery  2. Other depression Stable  Depression screen PHQ 2/9 03/05/2017  Decreased Interest 2  Down, Depressed, Hopeless 2  PHQ - 2 Score 4  Altered sleeping 2  Tired, decreased energy 2  Change in appetite 1  Feeling bad or failure about yourself  1  Trouble concentrating 0  Moving slowly or fidgety/restless 0  Suicidal thoughts 0  PHQ-9 Score 10  Difficult doing work/chores Somewhat difficult   9 score is 10 today.  HPI, Exam and A&P transcribed by Domingo Cocking, RMA under direction and in the presence of Julieanne Manson, MD.

## 2017-03-05 NOTE — Patient Instructions (Signed)
Start using heat at this time.

## 2017-03-12 ENCOUNTER — Ambulatory Visit (INDEPENDENT_AMBULATORY_CARE_PROVIDER_SITE_OTHER): Payer: BLUE CROSS/BLUE SHIELD | Admitting: Family Medicine

## 2017-03-12 ENCOUNTER — Encounter: Payer: Self-pay | Admitting: Family Medicine

## 2017-03-12 VITALS — BP 114/78 | HR 100 | Temp 98.3°F | Resp 14 | Ht 68.25 in | Wt 193.8 lb

## 2017-03-12 DIAGNOSIS — F3289 Other specified depressive episodes: Secondary | ICD-10-CM | POA: Diagnosis not present

## 2017-03-12 DIAGNOSIS — Z0000 Encounter for general adult medical examination without abnormal findings: Secondary | ICD-10-CM | POA: Diagnosis not present

## 2017-03-12 DIAGNOSIS — Z1211 Encounter for screening for malignant neoplasm of colon: Secondary | ICD-10-CM | POA: Diagnosis not present

## 2017-03-12 DIAGNOSIS — Z125 Encounter for screening for malignant neoplasm of prostate: Secondary | ICD-10-CM | POA: Diagnosis not present

## 2017-03-12 DIAGNOSIS — Z23 Encounter for immunization: Secondary | ICD-10-CM | POA: Diagnosis not present

## 2017-03-12 DIAGNOSIS — Z6829 Body mass index (BMI) 29.0-29.9, adult: Secondary | ICD-10-CM

## 2017-03-12 DIAGNOSIS — S46111A Strain of muscle, fascia and tendon of long head of biceps, right arm, initial encounter: Secondary | ICD-10-CM

## 2017-03-12 LAB — POCT URINALYSIS DIPSTICK
Bilirubin, UA: NEGATIVE
Glucose, UA: NEGATIVE
Ketones, UA: NEGATIVE
Leukocytes, UA: NEGATIVE
Nitrite, UA: NEGATIVE
PH UA: 6 (ref 5.0–8.0)
Protein, UA: NEGATIVE
RBC UA: NEGATIVE
SPEC GRAV UA: 1.02 (ref 1.010–1.025)
UROBILINOGEN UA: 0.2 U/dL

## 2017-03-12 NOTE — Progress Notes (Signed)
Patient: Ronald M Sw[place] Jr., Male    DOB: 1961-02-22, 56 y.o.   MRN: [id number] Visit Date: 03/12/2017  Today's Provider: Megan Mans, MD   Chief Complaint  Patient presents with  . Annual Exam   Subjective:  Ronald M [place] Jr. is a 56 y.o. male who presents today for health maintenance and complete physical. He feels well. He reports exercising not at this time. He reports he is sleeping well.  Last colonoscopy was 08/22/06 1 polyp, internal hemorrhoids, otherwise normal.  Review of Systems  Constitutional: Negative.   HENT: Negative.   Eyes: Negative.   Respiratory: Negative.   Cardiovascular: Negative.   Gastrointestinal: Negative.   Endocrine: Negative.   Genitourinary: Negative.   Musculoskeletal: Positive for neck stiffness.  Skin: Negative.   Allergic/Immunologic: Negative.   Neurological: Negative.   Hematological: Negative.   Psychiatric/Behavioral: Negative.     Social History   Social History  . Marital status: Married    Spouse name: N/A  . Number of children: N/A  . Years of education: N/A   Occupational History  . Not on file.   Social History Main Topics  . Smoking status: Never Smoker  . Smokeless tobacco: Current User    Types: Chew  . Alcohol use 1.8 oz/week    1 Glasses of wine, 2 Cans of beer per week  . Drug use: No  . Sexual activity: No   Other Topics Concern  . Not on file   Social History Narrative  . No narrative on file    Patient Active Problem List   Diagnosis Date Noted  . ADD (attention deficit disorder) 12/17/2014  . Anxiety 12/17/2014  . Acid reflux 12/17/2014  . ED (erectile dysfunction) of organic origin 12/17/2014  . Abdominal hernia 12/17/2014  . Avitaminosis D 12/17/2014  . BPH (benign prostatic hyperplasia) 09/29/2013  . Near syncope 09/29/2013  . Palpitations 11/25/2012  . Fatigue 11/25/2012  . Hyperlipidemia 11/25/2012  . Clinical depression 08/15/2009  . Esophagitis, reflux 12/05/2006  . Tobacco use  12/05/2006    Past Surgical History:  Procedure Laterality Date  . HERNIA REPAIR    . LAPAROSCOPY     diverticulitis  . MYRINGOTOMY    . NASAL SEPTUM SURGERY      His family history includes Arthritis in his father and mother; Cancer in his father, paternal grandfather, and paternal uncle; Congestive Heart Failure in his mother; Heart attack (age of onset: 48) in his father; Heart disease in his father and maternal grandfather.     Outpatient Encounter Prescriptions as of 03/12/2017  Medication Sig Note  . ALPRAZolam (XANAX) 0.5 MG tablet Take 1 tablet (0.5 mg total) by mouth every 8 (eight) hours as needed for anxiety.   . cyclobenzaprine (FLEXERIL) 10 MG tablet Take 1 tablet (10 mg total) by mouth at bedtime.   . Melatonin 5 MG CAPS Take by mouth.   . Probiotic Product (PROBIOTIC DAILY PO) Take by mouth daily.   . RABEprazole (ACIPHEX) 20 MG tablet Take 1 tablet (20 mg total) by mouth daily.   . tadalafil (CIALIS) 20 MG tablet Take by mouth. 12/17/2014: Medication taken as needed.  Received from: [facility]  . tamsulosin (FLOMAX) 0.4 MG CAPS capsule Take 1 capsule (0.4 mg total) by mouth daily.   Marland Kitchen VITAMIN D, ERGOCALCIFEROL, PO Take 5,000 mg by mouth daily.   . [DISCONTINUED] amphetamine-dextroamphetamine (ADDERALL) 20 MG tablet Take 20 mg by mouth 2 (two) times daily as needed.   . [  DISCONTINUED] sertraline (ZOLOFT) 100 MG tablet Take 2 tablets (200 mg total) by mouth daily.    No facility-administered encounter medications on file as of 03/12/2017.     Patient Care Team: Maple Hudson., MD as PCP - General (Family Medicine)      Objective:   Vitals:  Vitals:   03/12/17 1007  BP: 114/78  Pulse: 100  Resp: 14  Temp: 98.3 F (36.8 C)  Weight: 193 lb 12.8 oz (87.9 kg)  Height: 5' 8.25" (1.734 m)    Physical Exam  Constitutional: He is oriented to person, place, and time. He appears well-developed and well-nourished.  HENT:  Head: Normocephalic  and atraumatic.  Right Ear: External ear normal.  Left Ear: External ear normal.  Nose: Nose normal.  Mouth/Throat: Oropharynx is clear and moist.  Right myringotomy tube in place.  Eyes: Conjunctivae are normal. No scleral icterus.  Neck: No thyromegaly present.  Cardiovascular: Normal rate, regular rhythm, normal heart sounds and intact distal pulses.   Pulmonary/Chest: Effort normal and breath sounds normal.  Abdominal: Soft.  Genitourinary: Penis normal.  Musculoskeletal: Normal range of motion.  Neurological: He is alert and oriented to person, place, and time. No cranial nerve deficit. He exhibits normal muscle tone. Coordination normal.  Skin: Skin is warm and dry.  Psychiatric: He has a normal mood and affect. His behavior is normal. Judgment and thought content normal.     Depression Screen PHQ 2/9 Scores 03/12/2017 03/05/2017  PHQ - 2 Score 6 4  PHQ- 9 Score 13 10    Assessment & Plan:   1. Annual physical exam - CBC with Differential/Platelet - Lipid Profile - Comprehensive metabolic panel - TSH - POCT Urinalysis Dipstick  2. Colon cancer screening Refer back to Dr Mechele Collin for Colonoscopy. Defer DRE today.10 years since last. - Ambulatory referral to Gastroenterology  3. Other depression Will re check in 6 months.  Patient states he is doing well off of Zoloft. He states he is using leintact also.  4. Rupture long head biceps tendon, right, initial encounter Has a follow up with orthopedic today for further plan of treatment.  5. Need for immunization against influenza - Flu Vaccine QUAD 36+ mos IM  6. BMI 29.0-29.9,adult Exercise as you can. Work on better eating habits.  7. Prostate cancer screening - PSA  8. History of ADD  off of Adderall.  9. Status post sigmoid colectomy for diverticulitis  status post right hearing out of me to  status post deviated septum repair   HPI, Exam and A&P transcribed by Domingo Cocking, RMA under direction and in  the presence of Julieanne Manson, MD. I have done the exam and reviewed the chart and it is accurate to the best of my knowledge. Dentist has been used and  any errors in dictation or transcription are unintentional. Julieanne Manson M.D. [facility]

## 2017-03-12 NOTE — Patient Instructions (Signed)
Exercising to Stay Healthy Exercising regularly is important. It has many health benefits, such as:  Improving your overall fitness, flexibility, and endurance.  Increasing your bone density.  Helping with weight control.  Decreasing your body fat.  Increasing your muscle strength.  Reducing stress and tension.  Improving your overall health.  In order to become healthy and stay healthy, it is recommended that you do moderate-intensity and vigorous-intensity exercise. You can tell that you are exercising at a moderate intensity if you have a higher heart rate and faster breathing, but you are still able to hold a conversation. You can tell that you are exercising at a vigorous intensity if you are breathing much harder and faster and cannot hold a conversation while exercising. How often should I exercise? Choose an activity that you enjoy and set realistic goals. Your health care provider can help you to make an activity plan that works for you. Exercise regularly as directed by your health care provider. This may include:  Doing resistance training twice each week, such as: ? Push-ups. ? Sit-ups. ? Lifting weights. ? Using resistance bands.  Doing a given intensity of exercise for a given amount of time. Choose from these options: ? 150 minutes of moderate-intensity exercise every week. ? 75 minutes of vigorous-intensity exercise every week. ? A mix of moderate-intensity and vigorous-intensity exercise every week.  Children, pregnant women, people who are out of shape, people who are overweight, and older adults may need to consult a health care provider for individual recommendations. If you have any sort of medical condition, be sure to consult your health care provider before starting a new exercise program. What are some exercise ideas? Some moderate-intensity exercise ideas include:  Walking at a rate of 1 mile in 15  minutes.  Biking.  Hiking.  Golfing.  Dancing.  Some vigorous-intensity exercise ideas include:  Walking at a rate of at least 4.5 miles per hour.  Jogging or running at a rate of 5 miles per hour.  Biking at a rate of at least 10 miles per hour.  Lap swimming.  Roller-skating or in-line skating.  Cross-country skiing.  Vigorous competitive sports, such as football, basketball, and soccer.  Jumping rope.  Aerobic dancing.  What are some everyday activities that can help me to get exercise?  Yard work, such as: ? Pushing a lawn mower. ? Raking and bagging leaves.  Washing and waxing your car.  Pushing a stroller.  Shoveling snow.  Gardening.  Washing windows or floors. How can I be more active in my day-to-day activities?  Use the stairs instead of the elevator.  Take a walk during your lunch break.  If you drive, park your car farther away from work or school.  If you take public transportation, get off one stop early and walk the rest of the way.  Make all of your phone calls while standing up and walking around.  Get up, stretch, and walk around every 30 minutes throughout the day. What guidelines should I follow while exercising?  Do not exercise so much that you hurt yourself, feel dizzy, or get very short of breath.  Consult your health care provider before starting a new exercise program.  Wear comfortable clothes and shoes with good support.  Drink plenty of water while you exercise to prevent dehydration or heat stroke. Body water is lost during exercise and must be replaced.  Work out until you breathe faster and your heart beats faster. This information is not   intended to replace advice given to you by your health care provider. Make sure you discuss any questions you have with your health care provider. Document Released: 07/07/2010 Document Revised: 11/10/2015 Document Reviewed: 11/05/2013 Elsevier Interactive Patient Education  2018  Elsevier Inc.  

## 2017-03-14 LAB — COMPREHENSIVE METABOLIC PANEL
AG RATIO: 1.7 (calc) (ref 1.0–2.5)
ALT: 28 U/L (ref 9–46)
AST: 21 U/L (ref 10–35)
Albumin: 4.5 g/dL (ref 3.6–5.1)
Alkaline phosphatase (APISO): 65 U/L (ref 40–115)
BUN: 12 mg/dL (ref 7–25)
CO2: 27 mmol/L (ref 20–32)
CREATININE: 1.24 mg/dL (ref 0.70–1.33)
Calcium: 9.1 mg/dL (ref 8.6–10.3)
Chloride: 103 mmol/L (ref 98–110)
GLOBULIN: 2.6 g/dL (ref 1.9–3.7)
GLUCOSE: 94 mg/dL (ref 65–99)
Potassium: 4.5 mmol/L (ref 3.5–5.3)
SODIUM: 138 mmol/L (ref 135–146)
TOTAL PROTEIN: 7.1 g/dL (ref 6.1–8.1)
Total Bilirubin: 0.3 mg/dL (ref 0.2–1.2)

## 2017-03-14 LAB — CBC WITH DIFFERENTIAL/PLATELET
BASOS ABS: 112 {cells}/uL (ref 0–200)
BASOS PCT: 1.7 %
EOS ABS: 271 {cells}/uL (ref 15–500)
Eosinophils Relative: 4.1 %
HEMATOCRIT: 48.6 % (ref 38.5–50.0)
HEMOGLOBIN: 16.4 g/dL (ref 13.2–17.1)
Lymphs Abs: 1822 cells/uL (ref 850–3900)
MCH: 30.4 pg (ref 27.0–33.0)
MCHC: 33.7 g/dL (ref 32.0–36.0)
MCV: 90.2 fL (ref 80.0–100.0)
MONOS PCT: 10.2 %
MPV: 9.2 fL (ref 7.5–12.5)
NEUTROS ABS: 3722 {cells}/uL (ref 1500–7800)
Neutrophils Relative %: 56.4 %
PLATELETS: 315 10*3/uL (ref 140–400)
RBC: 5.39 10*6/uL (ref 4.20–5.80)
RDW: 12.6 % (ref 11.0–15.0)
Total Lymphocyte: 27.6 %
WBC: 6.6 10*3/uL (ref 3.8–10.8)
WBCMIX: 673 {cells}/uL (ref 200–950)

## 2017-03-14 LAB — LIPID PANEL
Cholesterol: 254 mg/dL — ABNORMAL HIGH (ref <200)
HDL: 52 mg/dL (ref >40)
LDL CHOLESTEROL (CALC): 150 mg/dL — AB
NON-HDL CHOLESTEROL (CALC): 202 mg/dL — AB (ref <130)
Total CHOL/HDL Ratio: 4.9 (calc) (ref <5.0)
Triglycerides: 341 mg/dL — ABNORMAL HIGH (ref <150)

## 2017-03-14 LAB — TSH: TSH: 1.66 mIU/L (ref 0.40–4.50)

## 2017-03-14 LAB — PSA: PSA: 1.8 ng/mL (ref <=4.0)

## 2017-03-15 ENCOUNTER — Telehealth: Payer: Self-pay

## 2017-03-15 NOTE — Telephone Encounter (Signed)
-----   Message from Ronald Welch., MD sent at 03/15/2017 12:38 PM EDT ----- Diet and exercise for high cholesterol. Consider Metamucil daily to help lower little bit

## 2017-03-15 NOTE — Telephone Encounter (Signed)
[id number]-KW 

## 2017-03-19 NOTE — Telephone Encounter (Signed)
LMTCB on home number, cell number was no answer and mailbox was full-Ronald Welch V Shirlean Berman, RMA

## 2017-03-26 NOTE — Telephone Encounter (Signed)
Called again today. Unable to get in touch with patient. Letter mailed to home address with information-Anastasiya Ander Purpura, RMA

## 2017-04-30 ENCOUNTER — Other Ambulatory Visit: Payer: Self-pay | Admitting: Family Medicine

## 2017-04-30 DIAGNOSIS — M62838 Other muscle spasm: Secondary | ICD-10-CM

## 2017-05-01 ENCOUNTER — Other Ambulatory Visit: Payer: Self-pay | Admitting: Family Medicine

## 2017-05-01 DIAGNOSIS — K2100 Gastro-esophageal reflux disease with esophagitis, without bleeding: Secondary | ICD-10-CM

## 2017-05-01 MED ORDER — ALPRAZOLAM 0.5 MG PO TABS: 0.5000 mg | ORAL_TABLET | Freq: Three times a day (TID) | ORAL | 3 refills | Status: DC | PRN

## 2017-05-01 MED ORDER — RABEPRAZOLE SODIUM 20 MG PO TBEC: 20.0000 mg | DELAYED_RELEASE_TABLET | Freq: Every day | ORAL | 11 refills | Status: DC

## 2017-05-01 NOTE — Telephone Encounter (Signed)
[facility] the following medications. Thanks CC  RABEprazole (ACIPHEX) 20 MG tablet   ALPRAZolam (XANAX) 0.5 MG tablet

## 2017-06-27 ENCOUNTER — Other Ambulatory Visit
Admission: RE | Admit: 2017-06-27 | Discharge: 2017-06-27 | Disposition: A | Discharge status: Home / Self Care | Payer: BLUE CROSS/BLUE SHIELD | Source: Ambulatory Visit | Attending: Student | Admitting: Student

## 2017-06-27 DIAGNOSIS — R195 Other fecal abnormalities: Secondary | ICD-10-CM | POA: Diagnosis not present

## 2017-06-27 LAB — GASTROINTESTINAL PANEL BY PCR, STOOL (REPLACES STOOL CULTURE)
ADENOVIRUS F40/41: NOT DETECTED
ASTROVIRUS: NOT DETECTED
Campylobacter species: NOT DETECTED
Cryptosporidium: NOT DETECTED
Cyclospora cayetanensis: NOT DETECTED
ENTEROTOXIGENIC E COLI (ETEC): NOT DETECTED
Entamoeba histolytica: NOT DETECTED
Enteroaggregative E coli (EAEC): NOT DETECTED
Enteropathogenic E coli (EPEC): NOT DETECTED
Giardia lamblia: NOT DETECTED
NOROVIRUS GI/GII: NOT DETECTED
PLESIMONAS SHIGELLOIDES: NOT DETECTED
Rotavirus A: NOT DETECTED
SAPOVIRUS (I, II, IV, AND V): NOT DETECTED
SHIGA LIKE TOXIN PRODUCING E COLI (STEC): NOT DETECTED
Salmonella species: NOT DETECTED
Shigella/Enteroinvasive E coli (EIEC): NOT DETECTED
VIBRIO CHOLERAE: NOT DETECTED
Vibrio species: NOT DETECTED
Yersinia enterocolitica: NOT DETECTED

## 2017-06-27 LAB — HM COLONOSCOPY

## 2017-07-01 LAB — CALPROTECTIN, FECAL: Calprotectin, Fecal: <16 ug/g (ref 0–120)

## 2017-07-08 ENCOUNTER — Encounter: Payer: Self-pay | Admitting: Unknown Physician Specialty

## 2017-07-08 NOTE — Progress Notes (Signed)
a 

## 2017-07-16 HISTORY — PX: BICEPS TENDON REPAIR: SHX566

## 2017-08-05 ENCOUNTER — Other Ambulatory Visit: Payer: Self-pay | Admitting: Family Medicine

## 2017-08-05 DIAGNOSIS — N400 Enlarged prostate without lower urinary tract symptoms: Secondary | ICD-10-CM

## 2017-08-05 MED ORDER — TAMSULOSIN HCL 0.4 MG PO CAPS: 0.4000 mg | ORAL_CAPSULE | Freq: Every day | ORAL | 5 refills | Status: DC

## 2017-08-05 NOTE — Telephone Encounter (Signed)
[facility] a refill request for the following medication. Thanks CC  tamsulosin (FLOMAX) 0.4 MG CAPS capsule

## 2017-09-09 ENCOUNTER — Ambulatory Visit: Payer: BLUE CROSS/BLUE SHIELD | Admitting: Family Medicine

## 2017-09-09 ENCOUNTER — Encounter: Payer: Self-pay | Admitting: Family Medicine

## 2017-09-09 VITALS — BP 122/84 | HR 78 | Temp 97.9°F | Resp 16 | Wt 196.0 lb

## 2017-09-09 DIAGNOSIS — F3289 Other specified depressive episodes: Secondary | ICD-10-CM

## 2017-09-09 MED ORDER — DESVENLAFAXINE SUCCINATE ER 50 MG PO TB24: 50.0000 mg | ORAL_TABLET | Freq: Every day | ORAL | 3 refills | Status: DC

## 2017-09-09 NOTE — Progress Notes (Signed)
Patient: Ronald M [place] Welch. Male    DOB: 1960/12/12   57 y.o.   MRN: [id number] Visit Date: 09/09/2017  Today's Provider: Megan Mans, MD   Chief Complaint  Patient presents with  . Depression   Subjective:    HPI Pt is here today to discuss depression. He reports that he was taking sertraline up until about 6 months ago. He felt like he made him feel like a "zombie" so he stopped it. He has been taking xanax instead. He wants to talk about possible taking Pristiq since it is now generic instead of the xanax sertraline combo. PHQ-9 = 18 today. No thoughts of hurting himself. He says he cannot afford a counselor or psychiatrist.  Depression screen [facility] 2/9 09/09/2017 03/12/2017 03/05/2017  Decreased Interest 3 3 2   Down, Depressed, Hopeless 3 3 2   PHQ - 2 Score 6 6 4   Altered sleeping 3 3 2   Tired, decreased energy 3 2 2   Change in appetite 3 0 1  Feeling bad or failure about yourself  3 2 1   Trouble concentrating 0 0 0  Moving slowly or fidgety/restless 0 0 0  Suicidal thoughts 0 0 0  PHQ-9 Score 18 13 10   Difficult doing work/chores Very difficult Somewhat difficult Somewhat difficult         Allergies  Allergen Reactions  . Venlafaxine     panic atack/anxiety/"flu like" symptoms-severe     Current Outpatient Medications:  .  ALPRAZolam (XANAX) 0.5 MG tablet, Take 1 tablet (0.5 mg total) every 8 (eight) hours as needed by mouth for anxiety., Disp: 90 tablet, Rfl: 3 .  Melatonin 5 MG CAPS, Take by mouth., Disp: , Rfl:  .  Probiotic Product (PROBIOTIC DAILY PO), Take by mouth daily., Disp: , Rfl:  .  RABEprazole (ACIPHEX) 20 MG tablet, Take 1 tablet (20 mg total) daily by mouth., Disp: 30 tablet, Rfl: 11 .  ranitidine (ZANTAC) 150 MG capsule, Take by mouth., Disp: , Rfl:  .  tamsulosin (FLOMAX) 0.4 MG CAPS capsule, Take 1 capsule (0.4 mg total) by mouth daily., Disp: 30 capsule, Rfl: 5 .  cyclobenzaprine (FLEXERIL) 10 MG tablet, Take 1 tablet (10 mg total) by  mouth at bedtime. (Patient not taking: Reported on 09/09/2017), Disp: 30 tablet, Rfl: 5 .  tadalafil (CIALIS) 20 MG tablet, Take by mouth., Disp: , Rfl:  .  VITAMIN D, ERGOCALCIFEROL, PO, Take 5,000 mg by mouth daily., Disp: , Rfl:   Review of Systems  Constitutional: Positive for fatigue.  HENT: Negative.   Eyes: Negative.   Respiratory: Negative.   Cardiovascular: Negative.   Gastrointestinal: Negative.   Endocrine: Negative.   Genitourinary: Negative.   Musculoskeletal: Negative.   Skin: Negative.   Allergic/Immunologic: Negative.   Neurological: Negative.   Hematological: Negative.   Psychiatric/Behavioral: Positive for dysphoric mood. The patient is nervous/anxious.     Social History   Tobacco Use  . Smoking status: Never Smoker  . Smokeless tobacco: Current User    Types: Chew  Substance Use Topics  . Alcohol use: Yes    Alcohol/week: 1.8 oz    Types: 1 Glasses of wine, 2 Cans of beer per week   Objective:   BP 122/84 (BP Location: Left Arm, Patient Position: Sitting, Cuff Size: Normal)   Pulse 78   Temp 97.9 F (36.6 C) (Oral)   Resp 16   Wt 196 lb (88.9 kg)   BMI 29.58 kg/m  Vitals:  09/09/17 1023  BP: 122/84  Pulse: 78  Resp: 16  Temp: 97.9 F (36.6 C)  TempSrc: Oral  Weight: 196 lb (88.9 kg)     Physical Exam  Constitutional: He is oriented to person, place, and time. He appears well-developed and well-nourished.  HENT:  Head: Normocephalic and atraumatic.  Eyes: Pupils are equal, round, and reactive to light. Conjunctivae and EOM are normal.  Neck: Normal range of motion. Neck supple. No thyromegaly present.  Cardiovascular: Normal rate, regular rhythm, normal heart sounds and intact distal pulses.  Pulmonary/Chest: Effort normal and breath sounds normal.  Abdominal: Soft.  Musculoskeletal: Normal range of motion.  Neurological: He is alert and oriented to person, place, and time. He has normal reflexes.  Skin: Skin is warm and dry.    Psychiatric: He has a normal mood and affect. His behavior is normal. Judgment and thought content normal.        Assessment & Plan:     1. depression Start Pristiq. Follow up in 1 month. Counseling would be very helpful for pt.Pt not suicidal. - desvenlafaxine (PRISTIQ) 50 MG 24 hr tablet; Take 1 tablet (50 mg total) by mouth daily.  Dispense: 90 tablet; Refill: 3      HPI, Exam, and A&P Transcribed under the direction and in the presence of Ronald Paolini L. Wendelyn BreslowGilbert Jr, MD  Electronically Signed: Silvio PateBrittany O'Dell, CMA   Ronald Callari Wendelyn BreslowGilbert Jr, MD  [facility]

## 2017-10-09 ENCOUNTER — Other Ambulatory Visit: Payer: Self-pay

## 2017-10-09 ENCOUNTER — Ambulatory Visit: Payer: BLUE CROSS/BLUE SHIELD | Admitting: Family Medicine

## 2017-10-09 VITALS — BP 118/80 | HR 90 | Temp 97.9°F | Resp 16 | Wt 194.0 lb

## 2017-10-09 DIAGNOSIS — F909 Attention-deficit hyperactivity disorder, unspecified type: Secondary | ICD-10-CM | POA: Diagnosis not present

## 2017-10-09 DIAGNOSIS — F419 Anxiety disorder, unspecified: Secondary | ICD-10-CM | POA: Diagnosis not present

## 2017-10-09 DIAGNOSIS — F3289 Other specified depressive episodes: Secondary | ICD-10-CM

## 2017-10-09 NOTE — Progress Notes (Signed)
Ronald M Swaziland Jr.  MRN: [id number] DOB: 06/27/1960  Subjective:  HPI   The patient is a 57 year old male who presents for follow up after starting Pristiq 1 month ago.  The patient states he is doing well and feels he is improving on the medicine.  He also relates that he was released by his surgeon to go back to work 2 weeks ago and feels this is helping him emotionally as well.   Patient Active Problem List   Diagnosis Date Noted  . ADD (attention deficit disorder) 12/17/2014  . Anxiety 12/17/2014  . Acid reflux 12/17/2014  . ED (erectile dysfunction) of organic origin 12/17/2014  . Abdominal hernia 12/17/2014  . Avitaminosis D 12/17/2014  . BPH (benign prostatic hyperplasia) 09/29/2013  . Near syncope 09/29/2013  . Palpitations 11/25/2012  . Fatigue 11/25/2012  . Hyperlipidemia 11/25/2012  . Clinical depression 08/15/2009  . Esophagitis, reflux 12/05/2006  . Tobacco use 12/05/2006    Past Medical History:  Diagnosis Date  . Depressive disorder   . ED (erectile dysfunction)   . Enthesopathy   . GERD (gastroesophageal reflux disease)   . H/O measles   . History of dysuria   . Hypercholesterolemia   . Meralgia paraesthetica   . Numbness of anterior thigh   . Pharyngitis, acute   . Sinusitis   . Tension headache   . Vitamin D deficiency     Social History   Socioeconomic History  . Marital status: Married    Spouse name: Not on file  . Number of children: Not on file  . Years of education: Not on file  . Highest education level: Not on file  Occupational History  . Not on file  Social Needs  . Financial resource strain: Not on file  . Food insecurity:    Worry: Not on file    Inability: Not on file  . Transportation needs:    Medical: Not on file    Non-medical: Not on file  Tobacco Use  . Smoking status: Never Smoker  . Smokeless tobacco: Current User    Types: Chew  Substance and Sexual Activity  . Alcohol use: Yes    Alcohol/week: 1.8 oz   Types: 1 Glasses of wine, 2 Cans of beer per week  . Drug use: No  . Sexual activity: Never  Lifestyle  . Physical activity:    Days per week: Not on file    Minutes per session: Not on file  . Stress: Not on file  Relationships  . Social connections:    Talks on phone: Not on file    Gets together: Not on file    Attends religious service: Not on file    Active member of club or organization: Not on file    Attends meetings of clubs or organizations: Not on file    Relationship status: Not on file  . Intimate partner violence:    Fear of current or ex partner: Not on file    Emotionally abused: Not on file    Physically abused: Not on file    Forced sexual activity: Not on file  Other Topics Concern  . Not on file  Social History Narrative  . Not on file    Outpatient Encounter Medications as of 10/09/2017  Medication Sig Note  . ALPRAZolam (XANAX) 0.5 MG tablet Take 1 tablet (0.5 mg total) every 8 (eight) hours as needed by mouth for anxiety.   . cyclobenzaprine (FLEXERIL) 10 MG tablet  Take 1 tablet (10 mg total) by mouth at bedtime.   Marland Kitchen. desvenlafaxine (PRISTIQ) 50 MG 24 hr tablet Take 1 tablet (50 mg total) by mouth daily.   . Melatonin 5 MG CAPS Take by mouth.   . Probiotic Product (PROBIOTIC DAILY PO) Take by mouth daily.   . RABEprazole (ACIPHEX) 20 MG tablet Take 1 tablet (20 mg total) daily by mouth.   . ranitidine (ZANTAC) 150 MG capsule Take by mouth.   . tadalafil (CIALIS) 20 MG tablet Take by mouth. 12/17/2014: Medication taken as needed.  [facility]  . tamsulosin (FLOMAX) 0.4 MG CAPS capsule Take 1 capsule (0.4 mg total) by mouth daily.   Marland Kitchen. VITAMIN D, ERGOCALCIFEROL, PO Take 5,000 mg by mouth daily.    No facility-administered encounter medications on file as of 10/09/2017.     Allergies  Allergen Reactions  . Venlafaxine     panic atack/anxiety/"flu like" symptoms-severe    Review of Systems  Constitutional: Negative for  fever and weight loss.  Eyes: Negative.   Respiratory: Negative for cough, shortness of breath and wheezing.   Cardiovascular: Negative for chest pain, palpitations, claudication and leg swelling.  Gastrointestinal: Negative.   Skin: Negative.   Neurological: Negative for dizziness and headaches.  Endo/Heme/Allergies: Negative.   Psychiatric/Behavioral: Positive for depression (still present but improved). Negative for hallucinations, memory loss, substance abuse and suicidal ideas. The patient is not nervous/anxious and does not have insomnia.     Objective:  BP 118/80 (BP Location: Right Arm, Patient Position: Sitting, Cuff Size: Normal)   Pulse 90   Temp 97.9 F (36.6 C) (Oral)   Resp 16   Wt 194 lb (88 kg)   BMI 29.28 kg/m   Physical Exam  Constitutional: He is oriented to person, place, and time and well-developed, well-nourished, and in no distress.  HENT:  Head: Normocephalic and atraumatic.  Eyes: Conjunctivae are normal.  Neck: No thyromegaly present.  Cardiovascular: Normal rate, regular rhythm and normal heart sounds.  Pulmonary/Chest: Effort normal and breath sounds normal.  Abdominal: Soft.  Neurological: He is alert and oriented to person, place, and time. Gait normal. GCS score is 15.  Skin: Skin is warm and dry.  Psychiatric: Mood, memory, affect and judgment normal.    Assessment and Plan :  MDD/GAD Improved. RTC 2-4 months. S/p Right arm surgery ADD  I have done the exam and reviewed the chart and it is accurate to the best of my knowledge. [employer]  technology has been used and  any errors in dictation or transcription are unintentional. Julieanne Mansonichard Clytee Heinrich M.D. [facility]

## 2017-11-05 ENCOUNTER — Other Ambulatory Visit: Payer: Self-pay | Admitting: Family Medicine

## 2017-12-02 ENCOUNTER — Other Ambulatory Visit: Payer: Self-pay | Admitting: Family Medicine

## 2017-12-02 DIAGNOSIS — M62838 Other muscle spasm: Secondary | ICD-10-CM

## 2018-01-30 ENCOUNTER — Other Ambulatory Visit: Payer: Self-pay | Admitting: Family Medicine

## 2018-03-07 ENCOUNTER — Other Ambulatory Visit: Payer: Self-pay | Admitting: Family Medicine

## 2018-03-07 DIAGNOSIS — N400 Enlarged prostate without lower urinary tract symptoms: Secondary | ICD-10-CM

## 2018-03-10 ENCOUNTER — Other Ambulatory Visit: Payer: Self-pay

## 2018-03-10 DIAGNOSIS — N400 Enlarged prostate without lower urinary tract symptoms: Secondary | ICD-10-CM

## 2018-03-10 MED ORDER — TAMSULOSIN HCL 0.4 MG PO CAPS: 0.4000 mg | ORAL_CAPSULE | Freq: Every day | ORAL | 11 refills | Status: DC

## 2018-03-18 ENCOUNTER — Other Ambulatory Visit: Payer: Self-pay | Admitting: Family Medicine

## 2018-04-08 ENCOUNTER — Ambulatory Visit (INDEPENDENT_AMBULATORY_CARE_PROVIDER_SITE_OTHER): Payer: BLUE CROSS/BLUE SHIELD | Admitting: Family Medicine

## 2018-04-08 VITALS — BP 122/82 | HR 96 | Temp 98.3°F | Resp 16 | Ht 68.0 in | Wt 201.0 lb

## 2018-04-08 DIAGNOSIS — M62838 Other muscle spasm: Secondary | ICD-10-CM

## 2018-04-08 DIAGNOSIS — Z0000 Encounter for general adult medical examination without abnormal findings: Secondary | ICD-10-CM

## 2018-04-08 DIAGNOSIS — Z125 Encounter for screening for malignant neoplasm of prostate: Secondary | ICD-10-CM | POA: Diagnosis not present

## 2018-04-08 DIAGNOSIS — K2100 Gastro-esophageal reflux disease with esophagitis, without bleeding: Secondary | ICD-10-CM

## 2018-04-08 DIAGNOSIS — Z23 Encounter for immunization: Secondary | ICD-10-CM | POA: Diagnosis not present

## 2018-04-08 DIAGNOSIS — N400 Enlarged prostate without lower urinary tract symptoms: Secondary | ICD-10-CM | POA: Diagnosis not present

## 2018-04-08 LAB — POCT URINALYSIS DIPSTICK
BILIRUBIN UA: NEGATIVE
Blood, UA: NEGATIVE
GLUCOSE UA: NEGATIVE
KETONES UA: NEGATIVE
Leukocytes, UA: NEGATIVE
Nitrite, UA: NEGATIVE
Protein, UA: NEGATIVE
Spec Grav, UA: 1.025 (ref 1.010–1.025)
Urobilinogen, UA: 0.2 E.U./dL
pH, UA: 5 (ref 5.0–8.0)

## 2018-04-08 MED ORDER — CYCLOBENZAPRINE HCL 10 MG PO TABS: 10.0000 mg | ORAL_TABLET | Freq: Every day | ORAL | 3 refills | Status: DC

## 2018-04-08 MED ORDER — TAMSULOSIN HCL 0.4 MG PO CAPS: 0.4000 mg | ORAL_CAPSULE | Freq: Every day | ORAL | 11 refills | Status: DC

## 2018-04-08 NOTE — Progress Notes (Signed)
Patient: Ronald M Swaziland Jr., Male    DOB: 26-Jul-1960, 57 y.o.   MRN: [id number] Visit Date: 04/08/2018  Today's Provider: Megan Mans, MD   Chief Complaint  Patient presents with  . Annual Exam   Subjective:  Ronald M Swaziland Jr. is a 57 y.o. male who presents today for health maintenance and complete physical. He feels well. He reports exercising none. He reports he is sleeping well.  06/27/17 Colonoscopy, [facility]-repeat 10 years. Depression screen [facility] 2/9 04/08/2018 09/09/2017 03/12/2017 03/05/2017  Decreased Interest 2 3 3 2   Down, Depressed, Hopeless 3 3 3 2   PHQ - 2 Score 5 6 6 4   Altered sleeping 2 3 3 2   Tired, decreased energy 3 3 2 2   Change in appetite 1 3 0 1  Feeling bad or failure about yourself  3 3 2 1   Trouble concentrating 0 0 0 0  Moving slowly or fidgety/restless 0 0 0 0  Suicidal thoughts 2 0 0 0  PHQ-9 Score 16 18 13 10   Difficult doing work/chores Very difficult Very difficult Somewhat difficult Somewhat difficult     Office Visit from 04/08/2018 in [facility]  AUDIT-C Score  9       Review of Systems  Constitutional: Negative.   HENT: Negative.   Eyes: Negative.   Respiratory: Negative.   Cardiovascular: Negative.   Gastrointestinal: Negative.   Endocrine: Negative.   Genitourinary: Negative.   Musculoskeletal: Positive for neck pain.  Skin: Negative.   Allergic/Immunologic: Negative.   Neurological: Negative.   Hematological: Negative.   Psychiatric/Behavioral: Negative.     Social History   Socioeconomic History  . Marital status: Married    Spouse name: Not on file  . Number of children: Not on file  . Years of education: Not on file  . Highest education level: Not on file  Occupational History  . Not on file  Social Needs  . Financial resource strain: Not on file  . Food insecurity:    Worry: Not on file    Inability: Not on file  . Transportation needs:    Medical: Not on file    Non-medical: Not on file   Tobacco Use  . Smoking status: Never Smoker  . Smokeless tobacco: Current User    Types: Chew  Substance and Sexual Activity  . Alcohol use: Yes    Alcohol/week: 3.0 standard drinks    Types: 1 Glasses of wine, 2 Cans of beer per week  . Drug use: No  . Sexual activity: Never  Lifestyle  . Physical activity:    Days per week: Not on file    Minutes per session: Not on file  . Stress: Not on file  Relationships  . Social connections:    Talks on phone: Not on file    Gets together: Not on file    Attends religious service: Not on file    Active member of club or organization: Not on file    Attends meetings of clubs or organizations: Not on file    Relationship status: Not on file  . Intimate partner violence:    Fear of current or ex partner: Not on file    Emotionally abused: Not on file    Physically abused: Not on file    Forced sexual activity: Not on file  Other Topics Concern  . Not on file  Social History Narrative  . Not on file    Patient Active Problem List   Diagnosis  Date Noted  . ADD (attention deficit disorder) 12/17/2014  . Anxiety 12/17/2014  . Acid reflux 12/17/2014  . ED (erectile dysfunction) of organic origin 12/17/2014  . Abdominal hernia 12/17/2014  . Avitaminosis D 12/17/2014  . BPH (benign prostatic hyperplasia) 09/29/2013  . Near syncope 09/29/2013  . Palpitations 11/25/2012  . Fatigue 11/25/2012  . Hyperlipidemia 11/25/2012  . Clinical depression 08/15/2009  . Esophagitis, reflux 12/05/2006  . Tobacco use 12/05/2006    Past Surgical History:  Procedure Laterality Date  . BICEPS TENDON REPAIR Right 07/16/2017  . HERNIA REPAIR    . LAPAROSCOPY     diverticulitis  . MYRINGOTOMY    . NASAL SEPTUM SURGERY      His family history includes Arthritis in his father and mother; Cancer in his father, paternal grandfather, and paternal uncle; Congestive Heart Failure in his mother; Heart attack (age of onset: 41) in his father; Heart  disease in his father and maternal grandfather.     Outpatient Encounter Medications as of 04/08/2018  Medication Sig Note  . ALPRAZolam (XANAX) 0.5 MG tablet TAKE ONE TABLET EVERY 8 HOURS AS NEEDED FOR ANXIETY.   . cyclobenzaprine (FLEXERIL) 10 MG tablet Take 1 tablet (10 mg total) by mouth at bedtime.   Marland Kitchen desvenlafaxine (PRISTIQ) 50 MG 24 hr tablet Take 1 tablet (50 mg total) by mouth daily.   . Melatonin 5 MG CAPS Take by mouth.   . Probiotic Product (PROBIOTIC DAILY PO) Take by mouth daily.   . RABEprazole (ACIPHEX) 20 MG tablet Take 1 tablet (20 mg total) daily by mouth.   . ranitidine (ZANTAC) 150 MG capsule Take by mouth.   . tamsulosin (FLOMAX) 0.4 MG CAPS capsule Take 1 capsule (0.4 mg total) by mouth daily.   Marland Kitchen VITAMIN D, ERGOCALCIFEROL, PO Take 5,000 mg by mouth daily.   . [DISCONTINUED] tadalafil (CIALIS) 20 MG tablet Take by mouth. 12/17/2014: Medication taken as needed.  Received from: [facility]   No facility-administered encounter medications on file as of 04/08/2018.     Patient Care Team: Maple Hudson., MD as PCP - General (Family Medicine)      Objective:   Vitals:  Vitals:   04/08/18 0838  BP: 122/82  Pulse: 96  Resp: 16  Temp: 98.3 F (36.8 C)  TempSrc: Oral  SpO2: 97%  Weight: 201 lb (91.2 kg)  Height: 5\' 8"  (1.727 m)    Physical Exam  Constitutional: He is oriented to person, place, and time. He appears well-developed and well-nourished.  HENT:  Head: Normocephalic and atraumatic.  Right Ear: External ear normal.  Left Ear: External ear normal.  Nose: Nose normal.  Mouth/Throat: Oropharynx is clear and moist.  Right myringotomy tube in place.  Eyes: Pupils are equal, round, and reactive to light. Conjunctivae and EOM are normal.  Neck: Normal range of motion. Neck supple.  Cardiovascular: Normal rate, regular rhythm, normal heart sounds and intact distal pulses.  Pulmonary/Chest: Effort normal and breath sounds normal.   Abdominal: Soft. Bowel sounds are normal.  Genitourinary: Rectum normal, prostate normal and penis normal.  Musculoskeletal: Normal range of motion.  Neurological: He is alert and oriented to person, place, and time.  Skin: Skin is warm and dry.  Psychiatric: He has a normal mood and affect. His behavior is normal. Judgment and thought content normal.     Depression Screen PHQ 2/9 Scores 04/08/2018 09/09/2017 03/12/2017 03/05/2017  PHQ - 2 Score 5 6 6 4   PHQ- 9 Score  16 18 13 10       Assessment & Plan:     Routine Health Maintenance and Physical Exam  Exercise Activities and Dietary recommendations Goals   None     Immunization History  Administered Date(s) Administered  . Influenza,inj,Quad PF,6+ Mos 02/22/2015, 03/12/2017  . Tdap 07/25/2010    Health Maintenance  Topic Date Due  . HIV Screening  01/28/1975  . INFLUENZA VACCINE  01/16/2018  . TETANUS/TDAP  07/25/2020  . COLONOSCOPY  06/28/2027  . Hepatitis C Screening  Completed     Discussed health benefits of physical activity, and encouraged him to engage in regular exercise appropriate for his age and condition.  1. Annual physical exam  - CBC with Differential/Platelet - Comprehensive metabolic panel - Lipid Panel With LDL/HDL Ratio - TSH - POCT urinalysis dipstick  2. Prostate cancer screening  - PSA  3. Muscle spasm  - cyclobenzaprine (FLEXERIL) 10 MG tablet; Take 1 tablet (10 mg total) by mouth at bedtime.  Dispense: 30 tablet; Refill: 3  4. BPH (benign prostatic hyperplasia)  - tamsulosin (FLOMAX) 0.4 MG CAPS capsule; Take 1 capsule (0.4 mg total) by mouth daily.  Dispense: 30 capsule; Refill: 11  5. Need for influenza vaccination  - Flu Vaccine QUAD 6+ mos PF IM (Fluarix Quad PF)  6. Esophagitis, reflux Stop Zantac.   HPI, Exam and A&P Transcribed under the direction and in the presence of Megan Mans., MD. Electronically Signed: Janey Greaser, RMA

## 2018-05-06 DIAGNOSIS — S86919A Strain of unspecified muscle(s) and tendon(s) at lower leg level, unspecified leg, initial encounter: Secondary | ICD-10-CM | POA: Insufficient documentation

## 2018-05-08 ENCOUNTER — Other Ambulatory Visit: Payer: Self-pay

## 2018-05-08 ENCOUNTER — Encounter
Admission: RE | Admit: 2018-05-08 | Discharge: 2018-05-08 | Disposition: A | Discharge status: Home / Self Care | Payer: BLUE CROSS/BLUE SHIELD | Source: Ambulatory Visit | Attending: Specialist | Admitting: Specialist

## 2018-05-08 HISTORY — DX: Personal history of other diseases of the digestive system: Z87.19

## 2018-05-08 HISTORY — DX: Adverse effect of unspecified anesthetic, initial encounter: T41.45XA

## 2018-05-08 HISTORY — DX: Other specified postprocedural states: R11.2

## 2018-05-08 HISTORY — DX: Other complications of anesthesia, initial encounter: T88.59XA

## 2018-05-08 HISTORY — DX: Other specified postprocedural states: Z98.890

## 2018-05-08 NOTE — Patient Instructions (Signed)
Your procedure is scheduled on: 05-12-18 Report to Same Day Surgery 2nd floor medical mall [facility](Medical Mall Entrance-take elevator on left to 2nd floor.  Check in with surgery information desk.) To find out your arrival time please call [phone] between 1PM - 3PM on 05-09-18  Remember: Instructions that are not followed completely may result in serious medical risk, up to and including death, or upon the discretion of your surgeon and anesthesiologist your surgery may need to be rescheduled.    _x___ 1. Do not eat food after midnight the night before your procedure. You may drink clear liquids up to 2 hours before you are scheduled to arrive at the hospital for your procedure.  Do not drink clear liquids within 2 hours of your scheduled arrival to the hospital.  Clear liquids include  --Water or Apple juice without pulp  --Clear carbohydrate beverage such as ClearFast or Gatorade  --Black Coffee or Clear Tea (No milk, no creamers, do not add anything to the coffee or Tea   ____Ensure clear carbohydrate drink on the way to the hospital for bariatric patients  ____Ensure clear carbohydrate drink 3 hours before surgery for Dr Rutherford NailByrnett's patients if physician instructed.   No gum chewing or hard candies.     __x__ 2. No Alcohol for 24 hours before or after surgery.   __x__3. No Smoking or e-cigarettes for 24 prior to surgery.  Do not use any chewable tobacco products for at least 6 hour prior to surgery   ____  4. Bring all medications with you on the day of surgery if instructed.    __x__ 5. Notify your doctor if there is any change in your medical condition     (cold, fever, infections).    x___6. On the morning of surgery brush your teeth with toothpaste and water.  You may rinse your mouth with mouth wash if you wish.  Do not swallow any toothpaste or mouthwash.   Do not wear jewelry, make-up, hairpins, clips or nail polish.  Do not wear lotions, powders, or perfumes. You may wear  deodorant.  Do not shave 48 hours prior to surgery. Men may shave face and neck.  Do not bring valuables to the hospital.    [facility] is not responsible for any belongings or valuables.               Contacts, dentures or bridgework may not be worn into surgery.  Leave your suitcase in the car. After surgery it may be brought to your room.  For patients admitted to the hospital, discharge time is determined by your treatment team.  _  Patients discharged the day of surgery will not be allowed to drive home.  You will need someone to drive you home and stay with you the night of your procedure.    Please read over the following fact sheets that you were given:   [facility] Pre[facility]   _x___ TAKE THE FOLLOWING MEDICATION THE MORNING OF SURGERY WITH A SMALL SIP OF WATER. These include:  1. ACIPHEX  2. YOU MAY TAKE XANAX DAY OF SURGERY IF NEEDED  3.  4.  5.  6.  ____Fleets enema or Magnesium Citrate as directed.   ____ Use CHG Soap or sage wipes as directed on instruction sheet   ____ Use inhalers on the day of surgery and bring to hospital day of surgery  ____ Stop Metformin and Janumet 2 days prior to surgery.    ____ Take  1/2 of usual insulin dose the night before surgery and none on the morning surgery.   ____ Follow recommendations from Cardiologist, Pulmonologist or PCP regarding  stopping Aspirin, Coumadin, Plavix ,Eliquis, Effient, or Pradaxa, and Pletal.  X____Stop Anti-inflammatories such as Advil, Aleve, Ibuprofen, Motrin, Naproxen,MELOXICAM, Naprosyn, Goodies powders or aspirin products. OK to take Tylenol    ____ Stop supplements until after surgery.     ____ Bring C-Pap to the hospital.

## 2018-05-09 ENCOUNTER — Other Ambulatory Visit: Payer: Self-pay | Admitting: Specialist

## 2018-05-09 ENCOUNTER — Other Ambulatory Visit: Payer: Self-pay | Admitting: Family Medicine

## 2018-05-09 DIAGNOSIS — K2100 Gastro-esophageal reflux disease with esophagitis, without bleeding: Secondary | ICD-10-CM

## 2018-05-11 MED ORDER — CLINDAMYCIN PHOSPHATE 600 MG/50ML IV SOLN: 600.0000 mg | INTRAVENOUS | Status: DC

## 2018-05-11 MED ORDER — CEFAZOLIN SODIUM-DEXTROSE 2-4 GM/100ML-% IV SOLN: 2.0000 g | INTRAVENOUS | Status: DC

## 2018-05-12 ENCOUNTER — Ambulatory Visit: Payer: BLUE CROSS/BLUE SHIELD | Admitting: Anesthesiology

## 2018-05-12 ENCOUNTER — Encounter
Admission: RE | Disposition: A | Discharge status: Home / Self Care | Payer: Self-pay | Source: Ambulatory Visit | Attending: Specialist

## 2018-05-12 ENCOUNTER — Encounter: Payer: Self-pay | Admitting: *Deleted

## 2018-05-12 ENCOUNTER — Other Ambulatory Visit: Payer: Self-pay

## 2018-05-12 ENCOUNTER — Ambulatory Visit
Admission: RE | Admit: 2018-05-12 | Discharge: 2018-05-12 | Disposition: A | Discharge status: Home / Self Care | Payer: BLUE CROSS/BLUE SHIELD | Source: Ambulatory Visit | Attending: Specialist | Admitting: Specialist

## 2018-05-12 DIAGNOSIS — N529 Male erectile dysfunction, unspecified: Secondary | ICD-10-CM | POA: Diagnosis not present

## 2018-05-12 DIAGNOSIS — K219 Gastro-esophageal reflux disease without esophagitis: Secondary | ICD-10-CM | POA: Diagnosis not present

## 2018-05-12 DIAGNOSIS — Z79899 Other long term (current) drug therapy: Secondary | ICD-10-CM | POA: Insufficient documentation

## 2018-05-12 DIAGNOSIS — F329 Major depressive disorder, single episode, unspecified: Secondary | ICD-10-CM | POA: Diagnosis not present

## 2018-05-12 DIAGNOSIS — S86012A Strain of left Achilles tendon, initial encounter: Secondary | ICD-10-CM | POA: Insufficient documentation

## 2018-05-12 DIAGNOSIS — E7800 Pure hypercholesterolemia, unspecified: Secondary | ICD-10-CM | POA: Diagnosis not present

## 2018-05-12 DIAGNOSIS — X58XXXA Exposure to other specified factors, initial encounter: Secondary | ICD-10-CM | POA: Insufficient documentation

## 2018-05-12 HISTORY — PX: ACHILLES TENDON SURGERY: SHX542

## 2018-05-12 SURGERY — REPAIR, TENDON, ACHILLES
Anesthesia: General | Site: Foot | Laterality: Left

## 2018-05-12 MED ORDER — LIDOCAINE HCL (PF) 1 % IJ SOLN
INTRAMUSCULAR | Status: AC
  Filled 2018-05-12: qty 5

## 2018-05-12 MED ORDER — FENTANYL CITRATE (PF) 100 MCG/2ML IJ SOLN
25.0000 ug | INTRAMUSCULAR | Status: DC | PRN
  Administered 2018-05-12 (×2): 50 ug via INTRAVENOUS
  Administered 2018-05-12: 25 ug via INTRAVENOUS

## 2018-05-12 MED ORDER — ACETAMINOPHEN 10 MG/ML IV SOLN
INTRAVENOUS | Status: AC
  Filled 2018-05-12: qty 100

## 2018-05-12 MED ORDER — ROPIVACAINE HCL 5 MG/ML IJ SOLN
INTRAMUSCULAR | Status: DC | PRN
  Administered 2018-05-12: 30 mL via PERINEURAL

## 2018-05-12 MED ORDER — CLINDAMYCIN PHOSPHATE 600 MG/50ML IV SOLN
INTRAVENOUS | Status: AC
  Filled 2018-05-12: qty 50

## 2018-05-12 MED ORDER — GABAPENTIN 400 MG PO CAPS: 400.0000 mg | ORAL_CAPSULE | Freq: Three times a day (TID) | ORAL | 3 refills | Status: DC

## 2018-05-12 MED ORDER — BUPIVACAINE HCL (PF) 0.5 % IJ SOLN
INTRAMUSCULAR | Status: AC
  Filled 2018-05-12: qty 30

## 2018-05-12 MED ORDER — PROPOFOL 10 MG/ML IV BOLUS
INTRAVENOUS | Status: DC | PRN
  Administered 2018-05-12: 180 mg via INTRAVENOUS

## 2018-05-12 MED ORDER — HYDROCODONE-ACETAMINOPHEN 7.5-325 MG PO TABS
ORAL_TABLET | ORAL | Status: AC
  Filled 2018-05-12: qty 1

## 2018-05-12 MED ORDER — ROPIVACAINE HCL 5 MG/ML IJ SOLN
INTRAMUSCULAR | Status: AC
  Filled 2018-05-12: qty 30

## 2018-05-12 MED ORDER — LACTATED RINGERS IV SOLN
INTRAVENOUS | Status: DC
  Administered 2018-05-12 (×4): via INTRAVENOUS

## 2018-05-12 MED ORDER — SEVOFLURANE IN SOLN
RESPIRATORY_TRACT | Status: AC
  Filled 2018-05-12: qty 250

## 2018-05-12 MED ORDER — CEFAZOLIN SODIUM-DEXTROSE 2-4 GM/100ML-% IV SOLN
INTRAVENOUS | Status: AC
  Filled 2018-05-12: qty 100

## 2018-05-12 MED ORDER — CHLORHEXIDINE GLUCONATE CLOTH 2 % EX PADS
6.0000 | MEDICATED_PAD | Freq: Once | CUTANEOUS | Status: AC
  Administered 2018-05-12: 6 via TOPICAL

## 2018-05-12 MED ORDER — FENTANYL CITRATE (PF) 100 MCG/2ML IJ SOLN
INTRAMUSCULAR | Status: AC
  Filled 2018-05-12: qty 2

## 2018-05-12 MED ORDER — HYDROCODONE-ACETAMINOPHEN 7.5-325 MG PO TABS: 1.0000 | ORAL_TABLET | Freq: Four times a day (QID) | ORAL | 0 refills | Status: DC | PRN

## 2018-05-12 MED ORDER — FENTANYL CITRATE (PF) 100 MCG/2ML IJ SOLN
INTRAMUSCULAR | Status: DC | PRN
  Administered 2018-05-12 (×3): 50 ug via INTRAVENOUS

## 2018-05-12 MED ORDER — DEXAMETHASONE SODIUM PHOSPHATE 10 MG/ML IJ SOLN
INTRAMUSCULAR | Status: DC | PRN
  Administered 2018-05-12: 10 mg via INTRAVENOUS

## 2018-05-12 MED ORDER — PHENYLEPHRINE HCL 10 MG/ML IJ SOLN
INTRAMUSCULAR | Status: DC | PRN
  Administered 2018-05-12 (×2): 100 ug via INTRAVENOUS

## 2018-05-12 MED ORDER — MELOXICAM 15 MG PO TABS: 15.0000 mg | ORAL_TABLET | Freq: Every day | ORAL | 3 refills | Status: DC

## 2018-05-12 MED ORDER — ACETAMINOPHEN 10 MG/ML IV SOLN
INTRAVENOUS | Status: DC | PRN
  Administered 2018-05-12: 1000 mg via INTRAVENOUS

## 2018-05-12 MED ORDER — LIDOCAINE HCL (CARDIAC) PF 100 MG/5ML IV SOSY
PREFILLED_SYRINGE | INTRAVENOUS | Status: DC | PRN
  Administered 2018-05-12: 100 mg via INTRAVENOUS

## 2018-05-12 MED ORDER — MIDAZOLAM HCL 2 MG/2ML IJ SOLN
INTRAMUSCULAR | Status: AC
  Filled 2018-05-12: qty 2

## 2018-05-12 MED ORDER — BUPIVACAINE HCL (PF) 0.5 % IJ SOLN
INTRAMUSCULAR | Status: DC | PRN
  Administered 2018-05-12: 30 mL

## 2018-05-12 MED ORDER — MELOXICAM 7.5 MG PO TABS
15.0000 mg | ORAL_TABLET | ORAL | Status: AC
  Administered 2018-05-12: 15 mg via ORAL

## 2018-05-12 MED ORDER — NEOMYCIN-POLYMYXIN B GU 40-200000 IR SOLN
Status: AC
  Filled 2018-05-12: qty 20

## 2018-05-12 MED ORDER — MIDAZOLAM HCL 2 MG/2ML IJ SOLN
INTRAMUSCULAR | Status: DC | PRN
  Administered 2018-05-12: 2 mg via INTRAVENOUS

## 2018-05-12 MED ORDER — SUCCINYLCHOLINE CHLORIDE 20 MG/ML IJ SOLN
INTRAMUSCULAR | Status: DC | PRN
  Administered 2018-05-12: 100 mg via INTRAVENOUS

## 2018-05-12 MED ORDER — NEOMYCIN-POLYMYXIN B GU 40-200000 IR SOLN
Status: DC | PRN
  Administered 2018-05-12: 4 mL

## 2018-05-12 MED ORDER — FENTANYL CITRATE (PF) 100 MCG/2ML IJ SOLN
INTRAMUSCULAR | Status: AC
  Filled 2018-05-12: qty 4

## 2018-05-12 MED ORDER — LIDOCAINE HCL (PF) 1 % IJ SOLN
INTRAMUSCULAR | Status: DC | PRN
  Administered 2018-05-12: 5 mL via SUBCUTANEOUS

## 2018-05-12 MED ORDER — MELOXICAM 7.5 MG PO TABS
ORAL_TABLET | ORAL | Status: AC
  Administered 2018-05-12: 15 mg via ORAL
  Filled 2018-05-12: qty 2

## 2018-05-12 MED ORDER — GABAPENTIN 300 MG PO CAPS
300.0000 mg | ORAL_CAPSULE | ORAL | Status: AC
  Administered 2018-05-12: 300 mg via ORAL

## 2018-05-12 MED ORDER — PROPOFOL 10 MG/ML IV BOLUS
INTRAVENOUS | Status: AC
  Filled 2018-05-12: qty 20

## 2018-05-12 MED ORDER — ONDANSETRON HCL 4 MG/2ML IJ SOLN
INTRAMUSCULAR | Status: DC | PRN
  Administered 2018-05-12 (×2): 4 mg via INTRAVENOUS

## 2018-05-12 MED ORDER — GABAPENTIN 300 MG PO CAPS
ORAL_CAPSULE | ORAL | Status: AC
  Administered 2018-05-12: 300 mg via ORAL
  Filled 2018-05-12: qty 1

## 2018-05-12 MED ORDER — MIDAZOLAM HCL 2 MG/2ML IJ SOLN
INTRAMUSCULAR | Status: AC
  Administered 2018-05-12: 1 mg via INTRAVENOUS
  Filled 2018-05-12: qty 2

## 2018-05-12 MED ORDER — ROCURONIUM BROMIDE 100 MG/10ML IV SOLN
INTRAVENOUS | Status: DC | PRN
  Administered 2018-05-12: 10 mg via INTRAVENOUS
  Administered 2018-05-12: 20 mg via INTRAVENOUS
  Administered 2018-05-12: 40 mg via INTRAVENOUS

## 2018-05-12 MED ORDER — PROMETHAZINE HCL 25 MG/ML IJ SOLN: 6.2500 mg | INTRAMUSCULAR | Status: DC | PRN

## 2018-05-12 MED ORDER — SUGAMMADEX SODIUM 200 MG/2ML IV SOLN
INTRAVENOUS | Status: DC | PRN
  Administered 2018-05-12: 173.4 mg via INTRAVENOUS

## 2018-05-12 MED ORDER — HYDROCODONE-ACETAMINOPHEN 7.5-325 MG PO TABS
1.0000 | ORAL_TABLET | Freq: Once | ORAL | Status: AC
  Administered 2018-05-12: 1 via ORAL

## 2018-05-12 MED ORDER — SUGAMMADEX SODIUM 200 MG/2ML IV SOLN
INTRAVENOUS | Status: AC
  Filled 2018-05-12: qty 2

## 2018-05-12 MED ORDER — MIDAZOLAM HCL 2 MG/2ML IJ SOLN
1.0000 mg | Freq: Once | INTRAMUSCULAR | Status: AC
  Administered 2018-05-12: 1 mg via INTRAVENOUS

## 2018-05-12 MED ORDER — FENTANYL CITRATE (PF) 100 MCG/2ML IJ SOLN
INTRAMUSCULAR | Status: AC
  Administered 2018-05-12: 50 ug via INTRAVENOUS
  Filled 2018-05-12: qty 2

## 2018-05-12 SURGICAL SUPPLY — 43 items
BNDG COHESIVE 4X5 TAN STRL (GAUZE/BANDAGES/DRESSINGS) ×2 IMPLANT
BNDG ESMARK 6X12 TAN STRL LF (GAUZE/BANDAGES/DRESSINGS) ×2 IMPLANT
CANISTER SUCT 1200ML W/VALVE (MISCELLANEOUS) ×2 IMPLANT
CHLORAPREP W/TINT 26ML (MISCELLANEOUS) ×2 IMPLANT
COVER WAND RF STERILE (DRAPES) ×2 IMPLANT
CUFF TOURN 24 STER (MISCELLANEOUS) IMPLANT
CUFF TOURN 30 STER DUAL PORT (MISCELLANEOUS) IMPLANT
ELECT REM PT RETURN 9FT ADLT (ELECTROSURGICAL) ×2
ELECTRODE REM PT RTRN 9FT ADLT (ELECTROSURGICAL) ×1 IMPLANT
GAUZE PETRO XEROFOAM 1X8 (MISCELLANEOUS) ×2 IMPLANT
GAUZE SPONGE 4X4 12PLY STRL (GAUZE/BANDAGES/DRESSINGS) ×4 IMPLANT
GLOVE BIO SURGEON STRL SZ8 (GLOVE) ×2 IMPLANT
GLOVE INDICATOR 8.0 STRL GRN (GLOVE) ×2 IMPLANT
GLOVE SURG ORTHO 8.5 STRL (GLOVE) ×2 IMPLANT
GOWN STRL REUS W/ TWL LRG LVL3 (GOWN DISPOSABLE) ×1 IMPLANT
GOWN STRL REUS W/TWL LRG LVL3 (GOWN DISPOSABLE) ×1
GOWN STRL REUS W/TWL LRG LVL4 (GOWN DISPOSABLE) ×2 IMPLANT
KIT TURNOVER KIT A (KITS) ×2 IMPLANT
LABEL OR SOLS (LABEL) ×2 IMPLANT
NDL SPNL 22GX3.5 QUINCKE BK (NEEDLE) IMPLANT
NEEDLE SPNL 22GX3.5 QUINCKE BK (NEEDLE) ×2 IMPLANT
NS IRRIG 1000ML POUR BTL (IV SOLUTION) ×2 IMPLANT
PACK EXTREMITY ARMC (MISCELLANEOUS) ×2 IMPLANT
PADDING CAST 4IN STRL (MISCELLANEOUS) ×2
PADDING CAST BLEND 4X4 STRL (MISCELLANEOUS) ×2 IMPLANT
PADDING CAST BLEND 6X4 STRL (MISCELLANEOUS) ×2 IMPLANT
PADDING STRL CAST 6IN (MISCELLANEOUS) ×2
SOL PREP PVP 2OZ (MISCELLANEOUS) ×2
SOLUTION PREP PVP 2OZ (MISCELLANEOUS) ×1 IMPLANT
SPONGE LAP 18X18 RF (DISPOSABLE) ×2 IMPLANT
STAPLER SKIN PROX 35W (STAPLE) ×2 IMPLANT
STOCKINETTE BIAS CUT 6 980064 (GAUZE/BANDAGES/DRESSINGS) ×2 IMPLANT
STOCKINETTE STRL 6IN 960660 (GAUZE/BANDAGES/DRESSINGS) ×2 IMPLANT
SUT ETHIBOND 3-0  EXTR (SUTURE) ×1
SUT ETHIBOND 3-0 EXTR (SUTURE) ×1 IMPLANT
SUT FIBERWIRE #5 38 CONV BLUE (SUTURE) ×2
SUT ORTHOCORD OS-6 NDL 36 (SUTURE) ×2 IMPLANT
SUT QUILL PDO 0 36 36 VIOLET (SUTURE) ×2 IMPLANT
SUT V-LOC 90 ABS DVC 3-0 CL (SUTURE) ×2 IMPLANT
SUT VIC AB 3-0 SH 27 (SUTURE) ×2
SUT VIC AB 3-0 SH 27X BRD (SUTURE) ×1 IMPLANT
SUTURE FIBERWR #5 38 CONV BLUE (SUTURE) IMPLANT
TAPE CAST 4X4 WHT DELT (MISCELLANEOUS) ×4 IMPLANT

## 2018-05-12 NOTE — Op Note (Signed)
05/12/2018  1:53 PM  PATIENT:  Ronald M SwazilandJordan Jr.    PRE-OPERATIVE DIAGNOSIS:  RUPTURE LEFT ACHILLES TENDON  POST-OPERATIVE DIAGNOSIS:  Same  PROCEDURE:  ACHILLES TENDON REPAIR  SURGEON:  Valinda HoarMILLER,Jaleyah Longhi E, MD  ANESTHESIA:   General, prone  PREOPERATIVE INDICATIONS:  Ronald M SwazilandJordan Jr. is a  57 y.o. male with a diagnosis of RUPTURE LEFT ACHILLES TENDON who was diagnosed with a complete Achilles tendon rupture and elected for surgical management.    The risks benefits and alternatives were discussed with the patient preoperatively including but not limited to the risks of infection, bleeding, nerve injury, cardiopulmonary complications, the need for revision surgery, among others, and the patient was willing to proceed.  EBL: None  TOURNIQUET TIME: 57  MIN  OPERATIVE IMPLANTS: #5 and #2 Fiberwires  OPERATIVE FINDINGS: Complete Achilles rupture 3 inches above the calcaneus  OPERATIVE PROCEDURE: The patient was brought to the operating room and underwent general endotracheal anesthesia.  The patient was then turned into the prone position on the operating room table and padded appropriately.  The operative leg was prepped and draped in a sterile fashion and an Esmarch bandage applied.  Tourniquet was inflated to 300 mmHg.  A posterior lateral incision was made along the distal aspect of the Achilles tendon.  Dissection was carried out bluntly through subcutaneous tissue, preserving veins and small sensory nerves.  The tendon sheath was then incised longitudinally after freeing it up from adhesions.  As much sheath as possible was preserved.  The Achilles tendon was seen to be completely ruptured and frayed.  The wound was irrigated free of debris and blood.  The tendon ends were debrided with scissors to remove frayed tissue.  Once this was accomplished, the tendon ends were reapproximated using a #5 Fiberwire suture in a modified Lister fashion, which was tied securely.  This provided good  continuity.  The tendon ends were then sutured further with #2 Fiberwire suture.  After further irrigation, the tendon sheath was closed with 3-0 Vicryl suture. Subcutaneous tissue was closed with 3-0 vicryll. The skin was closed with staples.  Half percent Sensorcaine was placed into the wound.  A dry sterile dressing with a well-padded short-leg cast was applied.  The ankle was allowed to drop into a comfortable amount of equinus.  Tourniquet was deflated with good return of blood flow to the foot.  Sponge and needle counts were correct.  Patient was awakened and returned to the supine position on a stretcher and taken to recovery in good condition.    Valinda HoarHoward E Fran Mcree, MD

## 2018-05-12 NOTE — Anesthesia Preprocedure Evaluation (Addendum)
Anesthesia Evaluation  Patient identified by MRN, date of birth, ID band Patient awake    Reviewed: Allergy & Precautions, H&P , NPO status , Patient's Chart, lab work & pertinent test results, reviewed documented beta blocker date and time   History of Anesthesia Complications (+) PONV and history of anesthetic complications  Airway Mallampati: I  TM Distance: >3 FB Neck ROM: full    Dental  (+) Teeth Intact, Dental Advidsory Given   Pulmonary neg shortness of breath, neg sleep apnea, neg COPD, Recent URI , Resolved,           Cardiovascular Exercise Tolerance: Good negative cardio ROS       Neuro/Psych neg Seizures PSYCHIATRIC DISORDERS Anxiety Depression  Neuromuscular disease    GI/Hepatic Neg liver ROS, hiatal hernia, GERD  ,  Endo/Other  negative endocrine ROS  Renal/GU negative Renal ROS  negative genitourinary   Musculoskeletal   Abdominal   Peds  Hematology negative hematology ROS (+)   Anesthesia Other Findings Past Medical History: No date: Complication of anesthesia No date: Depressive disorder No date: ED (erectile dysfunction) No date: Enthesopathy No date: GERD (gastroesophageal reflux disease) No date: H/O measles No date: History of dysuria No date: History of hiatal hernia     Comment:  SMALL  No date: Hypercholesterolemia No date: Meralgia paraesthetica No date: Numbness of anterior thigh No date: Pharyngitis, acute No date: PONV (postoperative nausea and vomiting)     Comment:  AFTER HERNIA SURGERY No date: Sinusitis No date: Tension headache No date: Vitamin D deficiency   Reproductive/Obstetrics negative OB ROS                             Anesthesia Physical Anesthesia Plan  ASA: II  Anesthesia Plan: General   Post-op Pain Management:  Regional for Post-op pain   Induction: Intravenous  PONV Risk Score and Plan: Ondansetron, Dexamethasone,  Midazolam, Promethazine and Treatment may vary due to age or medical condition  Airway Management Planned: LMA  Additional Equipment:   Intra-op Plan:   Post-operative Plan: Extubation in OR  Informed Consent: I have reviewed the patients History and Physical, chart, labs and discussed the procedure including the risks, benefits and alternatives for the proposed anesthesia with the patient or authorized representative who has indicated his/her understanding and acceptance.   Dental Advisory Given  Plan Discussed with: Anesthesiologist, CRNA and Surgeon  Anesthesia Plan Comments:        Anesthesia Quick Evaluation

## 2018-05-12 NOTE — Discharge Instructions (Signed)

## 2018-05-12 NOTE — H&P (Signed)
THE PATIENT WAS SEEN PRIOR TO SURGERY TODAY.  HISTORY, ALLERGIES, HOME MEDICATIONS AND OPERATIVE PROCEDURE WERE REVIEWED. RISKS AND BENEFITS OF SURGERY DISCUSSED WITH PATIENT AGAIN.  NO CHANGES FROM INITIAL HISTORY AND PHYSICAL NOTED.    

## 2018-05-12 NOTE — Progress Notes (Signed)
Capillary refill positive to left foot  Toes warm and dry

## 2018-05-12 NOTE — Transfer of Care (Signed)
Immediate Anesthesia Transfer of Care Note  Patient: Ronald M SwazilandJordan Jr.  Procedure(s) Performed: ACHILLES TENDON REPAIR (Left Foot)  Patient Location: PACU  Anesthesia Type:General  Level of Consciousness: sedated  Airway & Oxygen Therapy: Patient Spontanous Breathing and Patient connected to face mask oxygen  Post-op Assessment: Report given to RN and Post -op Vital signs reviewed and stable  Post vital signs: Reviewed and stable  Last Vitals:  Vitals Value Taken Time  BP 105/64 05/12/2018  2:12 PM  Temp 36.7 C 05/12/2018  2:12 PM  Pulse 74 05/12/2018  2:13 PM  Resp 13 05/12/2018  2:13 PM  SpO2 98 % 05/12/2018  2:13 PM  Vitals shown include unvalidated device data.  Last Pain:  Vitals:   05/12/18 1018  TempSrc: Tympanic  PainSc: 2          Complications: No apparent anesthesia complications

## 2018-05-12 NOTE — Anesthesia Procedure Notes (Signed)
Anesthesia Regional Block: Popliteal block   Pre-Anesthetic Checklist: ,, timeout performed, Correct Patient, Correct Site, Correct Laterality, Correct Procedure, Correct Position, site marked, Risks and benefits discussed,  Surgical consent,  Pre-op evaluation,  At surgeon's request and post-op pain management  Laterality: Lower and Left  Prep: chloraprep       Needles:  Injection technique: Single-shot  Needle Type: Echogenic Needle     Needle Length: 9cm  Needle Gauge: 21     Additional Needles:   Procedures:,,,, ultrasound used (permanent image in chart),,,,  Narrative:  Start time: 05/12/2018 4:43 PM End time: 05/12/2018 4:46 PM Injection made incrementally with aspirations every 5 mL.  Performed by: Personally  Anesthesiologist: Lenard SimmerKarenz, Ambrea Hegler, MD  Additional Notes: Functioning IV was confirmed and monitors were applied.  A echogenic needle was used. Sterile prep,hand hygiene and sterile gloves were used. Minimal sedation used for procedure.   No paresthesia endorsed by patient during the procedure.  Negative aspiration and negative test dose prior to incremental administration of local anesthetic. The patient tolerated the procedure well with no immediate complications.

## 2018-05-12 NOTE — Anesthesia Procedure Notes (Signed)
Procedure Name: Intubation Date/Time: 05/12/2018 12:30 PM Performed by: Nelda Marseille, CRNA Pre-anesthesia Checklist: Patient identified, Patient being monitored, Timeout performed, Emergency Drugs available and Suction available Patient Re-evaluated:Patient Re-evaluated prior to induction Oxygen Delivery Method: Circle system utilized Preoxygenation: Pre-oxygenation with 100% oxygen Induction Type: IV induction Ventilation: Mask ventilation without difficulty Laryngoscope Size: Mac, 3 and McGraph Grade View: Grade II Tube type: Oral Tube size: 7.5 mm Number of attempts: 1 Airway Equipment and Method: Stylet and Video-laryngoscopy Placement Confirmation: ETT inserted through vocal cords under direct vision,  positive ETCO2 and breath sounds checked- equal and bilateral Secured at: 21 cm Tube secured with: Tape Dental Injury: Teeth and Oropharynx as per pre-operative assessment  Difficulty Due To: Difficulty was anticipated and Difficult Airway- due to anterior larynx

## 2018-05-12 NOTE — Anesthesia Post-op Follow-up Note (Signed)
Anesthesia QCDR form completed.        

## 2018-05-13 ENCOUNTER — Encounter: Payer: Self-pay | Admitting: Specialist

## 2018-05-13 NOTE — Anesthesia Postprocedure Evaluation (Signed)
Anesthesia Post Note  Patient: Ronald M SwazilandJordan Jr.  Procedure(s) Performed: ACHILLES TENDON REPAIR (Left Foot)  Patient location during evaluation: PACU Anesthesia Type: General Level of consciousness: awake and alert Pain management: pain level controlled Vital Signs Assessment: post-procedure vital signs reviewed and stable Respiratory status: spontaneous breathing, nonlabored ventilation, respiratory function stable and patient connected to nasal cannula oxygen Cardiovascular status: blood pressure returned to baseline and stable Postop Assessment: no apparent nausea or vomiting Anesthetic complications: no     Last Vitals:  Vitals:   05/12/18 1600 05/12/18 1627  BP: (!) 141/84 136/84  Pulse: 83 93  Resp: 16   Temp: (!) 35.8 C   SpO2: 96% 96%    Last Pain:  Vitals:   05/12/18 1627  TempSrc:   PainSc: 4                  Lenard SimmerAndrew Evona Westra

## 2018-06-06 ENCOUNTER — Emergency Department: Payer: BLUE CROSS/BLUE SHIELD

## 2018-06-06 ENCOUNTER — Emergency Department
Admission: EM | Admit: 2018-06-06 | Discharge: 2018-06-06 | Disposition: A | Discharge status: Home / Self Care | Payer: BLUE CROSS/BLUE SHIELD | Attending: Emergency Medicine | Admitting: Emergency Medicine

## 2018-06-06 ENCOUNTER — Emergency Department (HOSPITAL_COMMUNITY): Payer: BLUE CROSS/BLUE SHIELD

## 2018-06-06 DIAGNOSIS — Z79899 Other long term (current) drug therapy: Secondary | ICD-10-CM | POA: Diagnosis not present

## 2018-06-06 DIAGNOSIS — F17220 Nicotine dependence, chewing tobacco, uncomplicated: Secondary | ICD-10-CM | POA: Insufficient documentation

## 2018-06-06 DIAGNOSIS — M25552 Pain in left hip: Secondary | ICD-10-CM | POA: Diagnosis not present

## 2018-06-06 LAB — CBC WITH DIFFERENTIAL/PLATELET
Abs Immature Granulocytes: 0.08 10*3/uL — ABNORMAL HIGH (ref 0.00–0.07)
BASOS ABS: 0.1 10*3/uL (ref 0.0–0.1)
Basophils Relative: 1 %
EOS ABS: 0 10*3/uL (ref 0.0–0.5)
Eosinophils Relative: 0 %
HCT: 48.9 % (ref 39.0–52.0)
Hemoglobin: 16.4 g/dL (ref 13.0–17.0)
IMMATURE GRANULOCYTES: 1 %
Lymphocytes Relative: 15 %
Lymphs Abs: 1.5 10*3/uL (ref 0.7–4.0)
MCH: 30 pg (ref 26.0–34.0)
MCHC: 33.5 g/dL (ref 30.0–36.0)
MCV: 89.4 fL (ref 80.0–100.0)
MONOS PCT: 7 %
Monocytes Absolute: 0.7 10*3/uL (ref 0.1–1.0)
NEUTROS ABS: 7.8 10*3/uL — AB (ref 1.7–7.7)
NEUTROS PCT: 76 %
NRBC: 0 % (ref 0.0–0.2)
PLATELETS: 247 10*3/uL (ref 150–400)
RBC: 5.47 MIL/uL (ref 4.22–5.81)
RDW: 12.6 % (ref 11.5–15.5)
WBC: 10.1 10*3/uL (ref 4.0–10.5)

## 2018-06-06 LAB — BASIC METABOLIC PANEL
Anion gap: 8 (ref 5–15)
BUN: 15 mg/dL (ref 6–20)
CALCIUM: 8.7 mg/dL — AB (ref 8.9–10.3)
CO2: 23 mmol/L (ref 22–32)
Chloride: 105 mmol/L (ref 98–111)
Creatinine, Ser: 1.05 mg/dL (ref 0.61–1.24)
Glucose, Bld: 110 mg/dL — ABNORMAL HIGH (ref 70–99)
Potassium: 3.6 mmol/L (ref 3.5–5.1)
SODIUM: 136 mmol/L (ref 135–145)

## 2018-06-06 LAB — SEDIMENTATION RATE: Sed Rate: 1 mm/hr (ref 0–20)

## 2018-06-06 MED ORDER — MORPHINE SULFATE (PF) 4 MG/ML IV SOLN
4.0000 mg | Freq: Once | INTRAVENOUS | Status: AC
  Administered 2018-06-06: 4 mg via INTRAVENOUS
  Filled 2018-06-06: qty 1

## 2018-06-06 MED ORDER — KETOROLAC TROMETHAMINE 30 MG/ML IJ SOLN
INTRAMUSCULAR | Status: AC
  Administered 2018-06-06: 15 mg via INTRAVENOUS
  Filled 2018-06-06: qty 1

## 2018-06-06 MED ORDER — MORPHINE SULFATE (PF) 4 MG/ML IV SOLN
INTRAVENOUS | Status: AC
  Administered 2018-06-06: 8 mg via INTRAVENOUS
  Filled 2018-06-06: qty 2

## 2018-06-06 MED ORDER — ONDANSETRON HCL 4 MG/2ML IJ SOLN
4.0000 mg | INTRAMUSCULAR | Status: AC
  Administered 2018-06-06: 4 mg via INTRAVENOUS
  Filled 2018-06-06: qty 2

## 2018-06-06 MED ORDER — KETOROLAC TROMETHAMINE 30 MG/ML IJ SOLN
15.0000 mg | Freq: Once | INTRAMUSCULAR | Status: AC
  Administered 2018-06-06: 15 mg via INTRAVENOUS

## 2018-06-06 MED ORDER — OXYCODONE-ACETAMINOPHEN 5-325 MG PO TABS: 1.0000 | ORAL_TABLET | ORAL | 0 refills | Status: DC | PRN

## 2018-06-06 MED ORDER — MORPHINE SULFATE (PF) 4 MG/ML IV SOLN
8.0000 mg | Freq: Once | INTRAVENOUS | Status: AC
  Administered 2018-06-06: 8 mg via INTRAVENOUS

## 2018-06-06 NOTE — ED Notes (Signed)
ED Provider at bedside. 

## 2018-06-06 NOTE — ED Notes (Signed)
Pt is awaiting MRI results. Will continue to monitor the pt.

## 2018-06-06 NOTE — ED Provider Notes (Addendum)
[facility] Emergency Department Provider Note  ____________________________________________   First MD Initiated Contact with Patient 06/06/18 [phone]     (approximate)  I have reviewed the triage vital signs and the nursing notes.   HISTORY  Chief Complaint Hip Pain    HPI Ronald M Swazil[place]Jordan Jr. is a 57 y.o. male with medical history as listed below which notably includes left Achilles tendon surgery about a month ago by Dr. Hyacinth MeekerMiller.  He is still in a left leg short cast.  He presents by EMS tonight for evaluation of severe left hip pain.  He reports that the pain started about 2 days ago and has been gradually getting worse.  The pain is worse with weightbearing and attempts at ambulation, even on crutches.  Raising the leg up makes it feel a little bit better while he is lying flat.  He denies fever/chills, chest pain, shortness of breath, nausea, vomiting, and abdominal pain.  He denies any recent falls or other traumas although he states he did fall about a month ago and landed on that hip.  He has had no swelling or new numbness nor tingling.  No weakness in the affected leg.  He has no pain from about mid thigh down.  The pain is very specific to the lateral aspect of his left hip and he says it is a very severe aching pain.  He has no history of blood clots in the legs of the lungs but he is not on anticoagulation after his surgery.  Past Medical History:  Diagnosis Date  . Complication of anesthesia   . Depressive disorder   . ED (erectile dysfunction)   . Enthesopathy   . GERD (gastroesophageal reflux disease)   . H/O measles   . History of dysuria   . History of hiatal hernia    SMALL   . Hypercholesterolemia   . Meralgia paraesthetica   . Numbness of anterior thigh   . Pharyngitis, acute   . PONV (postoperative nausea and vomiting)    AFTER HERNIA SURGERY  . Sinusitis   . Tension headache   . Vitamin D deficiency     Patient Active Problem List     Diagnosis Date Noted  . ADD (attention deficit disorder) 12/17/2014  . Anxiety 12/17/2014  . Acid reflux 12/17/2014  . ED (erectile dysfunction) of organic origin 12/17/2014  . Abdominal hernia 12/17/2014  . Avitaminosis D 12/17/2014  . BPH (benign prostatic hyperplasia) 09/29/2013  . Near syncope 09/29/2013  . Palpitations 11/25/2012  . Fatigue 11/25/2012  . Hyperlipidemia 11/25/2012  . Clinical depression 08/15/2009  . Esophagitis, reflux 12/05/2006  . Tobacco use 12/05/2006    Past Surgical History:  Procedure Laterality Date  . ACHILLES TENDON SURGERY Left 05/12/2018   Procedure: ACHILLES TENDON REPAIR;  Surgeon: Deeann SaintMiller, Howard, MD;  Location: [facility] ORS;  Service: Orthopedics;  Laterality: Left;  . BICEPS TENDON REPAIR Right 07/16/2017  . HERNIA REPAIR    . LAPAROSCOPY     diverticulitis  . MYRINGOTOMY    . NASAL SEPTUM SURGERY      Prior to Admission medications   Medication Sig Start Date End Date Taking? Authorizing Provider  ALPRAZolam (XANAX) 0.5 MG tablet TAKE ONE TABLET EVERY 8 HOURS AS NEEDED FOR ANXIETY. Patient taking differently: Take 0.25-0.5 mg by mouth 2 (two) times daily as needed for anxiety.  03/20/18  Yes Maple HudsonGilbert, Richard L Jr., MD  cyclobenzaprine (FLEXERIL) 10 MG tablet Take 1 tablet (10 mg total) by  mouth at bedtime. Patient taking differently: Take 10 mg by mouth daily as needed for muscle spasms.  04/08/18  Yes Maple Hudson., MD  gabapentin (NEURONTIN) 400 MG capsule Take 1 capsule (400 mg total) by mouth 3 (three) times daily. 05/12/18  Yes Deeann Saint, MD  HYDROcodone-acetaminophen (NORCO) 7.5-325 MG tablet Take 1 tablet by mouth every 6 (six) hours as needed for moderate pain. 05/12/18  Yes Deeann Saint, MD  meloxicam (MOBIC) 15 MG tablet Take 1 tablet (15 mg total) by mouth daily. 05/12/18  Yes Deeann Saint, MD  Probiotic Product (PROBIOTIC DAILY PO) Take 1 capsule by mouth daily.    Yes [provider]  RABEprazole  (ACIPHEX) 20 MG tablet Take 1 tablet (20 mg total) daily by mouth. 05/09/18  Yes Maple Hudson., MD  tamsulosin (FLOMAX) 0.4 MG CAPS capsule Take 1 capsule (0.4 mg total) by mouth daily. Patient taking differently: Take 0.4 mg by mouth daily after supper.  04/08/18  Yes Maple Hudson., MD  desvenlafaxine (PRISTIQ) 50 MG 24 hr tablet Take 1 tablet (50 mg total) by mouth daily. Patient not taking: Reported on 05/07/2018 09/09/17   Maple Hudson., MD    Allergies Venlafaxine  Family History  Problem Relation Age of Onset  . Heart disease Father   . Heart attack Father 37       s/p stent placement  . Cancer Father        prostate gland  . Arthritis Father   . Arthritis Mother   . Congestive Heart Failure Mother   . Cancer Paternal Uncle        prostate  . Heart disease Maternal Grandfather   . Cancer Paternal Grandfather     Social History Social History   Tobacco Use  . Smoking status: Never Smoker  . Smokeless tobacco: Current User    Types: Chew  Substance Use Topics  . Alcohol use: Yes    Alcohol/week: 3.0 standard drinks    Types: 1 Glasses of wine, 2 Cans of beer per week    Comment: WINE AND BEER OCC  . Drug use: No    Review of Systems Constitutional: No fever/chills Eyes: No visual changes. ENT: No sore throat. Cardiovascular: Denies chest pain. Respiratory: Denies shortness of breath. Gastrointestinal: No abdominal pain.  No nausea, no vomiting.  No diarrhea.  No constipation. Genitourinary: Negative for dysuria. Musculoskeletal: Severe pain in left hip as described above. Integumentary: Negative for rash. Neurological: Negative for headaches, focal weakness or numbness.   ____________________________________________   PHYSICAL EXAM:  VITAL SIGNS: ED Triage Vitals  Enc Vitals Group     BP 06/06/18 0359 (!) 145/90     Pulse Rate 06/06/18 0359 85     Resp 06/06/18 0359 18     Temp 06/06/18 0359 97.6 F (36.4 C)     Temp src  --      SpO2 06/06/18 0359 100 %     Weight 06/06/18 0356 90.7 kg (200 lb)     Height 06/06/18 0356 1.715 m (5' 7.5")     Head Circumference --      Peak Flow --      Pain Score 06/06/18 0356 10     Pain Loc --      Pain Edu? --      Excl. in GC? --     Constitutional: Alert and oriented.  Appears very uncomfortable due to pain. Eyes: Conjunctivae are normal.  Head: Atraumatic.  Nose: No congestion/rhinnorhea. Mouth/Throat: Mucous membranes are moist. Neck: No stridor.  No meningeal signs.   Cardiovascular: Normal rate, regular rhythm. Good peripheral circulation. Grossly normal heart sounds. Respiratory: Patient is splinting his breathing due to the pain in his left hip.  No retractions. Lungs CTAB. Gastrointestinal: Soft and nontender. No distention.  Musculoskeletal: Left short leg cast in place.  Patient has normal sensation in the exposed toes and although they are little bit cool to the touch, his right foot feels similar.  He is having no pain or discomfort of the lower leg.  He has no reproducible tenderness to palpation, no fluctuance, no induration, and no obvious acute deformities of the left hip.  He reports that the pain feels like it is deep inside and cannot be palpated from the outside.  He has a little bit of relief from the pain when I passively elevate his left leg.  His left leg compartments are soft and easily compressible as are the gluteal compartments. Neurologic:  Normal speech and language. No gross focal neurologic deficits are appreciated.  Skin:  Skin is warm, dry and intact. No rash noted. Psychiatric: Mood and affect are normal. Speech and behavior are normal.  ____________________________________________   LABS (all labs ordered are listed, but only abnormal results are displayed)  Labs Reviewed  CBC WITH DIFFERENTIAL/PLATELET - Abnormal; Notable for the following components:      Result Value   Neutro Abs 7.8 (*)    Abs Immature Granulocytes 0.08 (*)     All other components within normal limits  BASIC METABOLIC PANEL - Abnormal; Notable for the following components:   Glucose, Bld 110 (*)    Calcium 8.7 (*)    All other components within normal limits  SEDIMENTATION RATE   ____________________________________________  EKG  No indication for EKG ____________________________________________  RADIOLOGY I, Loleta Rose, personally discussed these images and results by phone with the on-call radiologist and used this discussion as part of my medical decision making.    ED MD interpretation:  MRI left hip is pending  Official radiology report(s): No results found.  ____________________________________________   PROCEDURES  Critical Care performed: No   Procedure(s) performed:   Procedures   ____________________________________________   INITIAL IMPRESSION / ASSESSMENT AND PLAN / ED COURSE  As part of my medical decision making, I reviewed the following data within the electronic MEDICAL RECORD NUMBER Nursing notes reviewed and incorporated, Labs reviewed , Old chart reviewed, Patient signed out to Dr. Lenard Lance, Notes from prior ED visits and [place] Controlled Substance Database    Differential diagnosis includes, but is not limited to, musculoskeletal strain, iliopsoas inflammation or abscess/infection, occult fracture of the left hip/pelvis, DVT, compartment syndrome.  The patient has no palpable tenderness and his compartments are soft.  The pain seems mostly musculoskeletal but is acute in onset in the absence of trauma.  I considered CT scan but I think it is unlikely to be diagnostic in this particular setting.  DVT seems less likely due to the location of the pain, being in the left lateral side of the pelvis and not in any vascular distribution.  He is having no pain in the lower leg.  I will attempt to obtain an MRI of the left hip for optimal evaluation of all the various causes of the pain.  I discussed the case by  phone with Dr. Andria Meuse with radiology to make sure I was ordering the appropriate imaging and he agreed with the plan.  Basic labs are pending.  PDMP reviewed during this encounter.  Clinical Course as of Jun 06 716  Fri Jun 06, 2018  [id number] Sed Rate: 1 [CF]  [phone] Normal BMP  Basic metabolic panel(!) [CF]  [id number] Normal CBC  CBC with Differential/Platelet(!) [CF]  [id number] Patient still reporting 9/10 pain.  Ordered morphine 8 mg IV and Toradol 15 mg IV.   [CF]  [id number] Transferring ED care to Dr. Lenard LancePaduchowski to review MRI results and dispo appropriately.   [CF]    Clinical Course User Index [CF] Loleta RoseForbach, Erinn Mendosa, MD    ____________________________________________  FINAL CLINICAL IMPRESSION(S) / ED DIAGNOSES  Final diagnoses:  Left hip pain     MEDICATIONS GIVEN DURING THIS VISIT:  Medications  morphine 4 MG/ML injection 4 mg (4 mg Intravenous Given 06/06/18 0442)  ondansetron (ZOFRAN) injection 4 mg (4 mg Intravenous Given 06/06/18 0442)  ketorolac (TORADOL) 30 MG/ML injection 15 mg (15 mg Intravenous Given 06/06/18 0620)  morphine 4 MG/ML injection 8 mg (8 mg Intravenous Given 06/06/18 [id number])     ED Discharge Orders    None       Note:  This document was prepared using Dragon voice recognition software and may include unintentional dictation errors.    Loleta RoseForbach, Odetta Forness, MD 06/06/18 Loralie [id number]    Loleta RoseForbach, Pavan Bring, MD 06/06/18 [phone]

## 2018-06-06 NOTE — ED Notes (Signed)
Patient transported to MRI 

## 2018-06-06 NOTE — ED Notes (Signed)
Patient returned from MRI.

## 2018-06-06 NOTE — ED Notes (Signed)
Patient took hydrocodone, 5mg  flexeril, and gabapentin at midnight.

## 2018-06-06 NOTE — ED Provider Notes (Signed)
-----------------------------------------   9:30 AM on 06/06/2018 -----------------------------------------  Patient care assumed from Dr. Karma Greaser.  Patient continues have pain in the left hip although states it is improved after pain medication.  MRI is essentially negative of the left hip.  Work-up including white blood cell count and ESR are reassuring.  We will change the patient's hydrocodone to oxycodone for better pain control at home.  I discussed with the patient continued nonweightbearing status of the leg and follow-up with his orthopedist.  Pain is very much located to the lateral left hip, there is no thigh pain there is no popliteal fossa pain no sign of blood clot.  Patient denies any fever any chest pain or shortness of breath.  I discussed return precautions I also discussed follow-up again with Dr. Sabra Heck today.  Patient agreeable to plan of care.  I discussed not taking hydrocodone if the patient is going to be taking oxycodone, he is agreeable and understands this.   Harvest Dark, MD 06/06/18 [phone]

## 2018-06-06 NOTE — ED Triage Notes (Signed)
Patient coming [facility] from home for left hip pain. Patient with cast on left lower leg post achilles repair in November. Patient seen by MD Hyacinth MeekerMiller on 12/19 for this pain, xrays performed, no structural abnormalities seen.   EMS Vital: 160/84, HR 88

## 2018-06-06 NOTE — ED Notes (Signed)
Pulse oximetry does not read on injured foot

## 2018-06-13 DIAGNOSIS — M5416 Radiculopathy, lumbar region: Secondary | ICD-10-CM | POA: Insufficient documentation

## 2018-07-28 ENCOUNTER — Other Ambulatory Visit: Payer: Self-pay | Admitting: Family Medicine

## 2018-07-29 NOTE — Telephone Encounter (Signed)
Pharmacy requesting refills. Thanks!  

## 2018-09-01 ENCOUNTER — Other Ambulatory Visit: Payer: Self-pay | Admitting: Family Medicine

## 2018-09-19 ENCOUNTER — Other Ambulatory Visit: Payer: Self-pay | Admitting: Family Medicine

## 2018-09-19 DIAGNOSIS — M62838 Other muscle spasm: Secondary | ICD-10-CM

## 2018-10-03 ENCOUNTER — Other Ambulatory Visit: Payer: Self-pay | Admitting: Family Medicine

## 2018-12-18 ENCOUNTER — Other Ambulatory Visit: Payer: Self-pay | Admitting: Family Medicine

## 2019-02-26 ENCOUNTER — Ambulatory Visit (INDEPENDENT_AMBULATORY_CARE_PROVIDER_SITE_OTHER): Payer: BLUE CROSS/BLUE SHIELD | Admitting: Family Medicine

## 2019-02-26 ENCOUNTER — Other Ambulatory Visit: Payer: Self-pay

## 2019-02-26 ENCOUNTER — Encounter: Payer: Self-pay | Admitting: Family Medicine

## 2019-02-26 VITALS — BP 128/98 | HR 96 | Temp 98.1°F | Resp 18 | Wt 196.6 lb

## 2019-02-26 DIAGNOSIS — M5432 Sciatica, left side: Secondary | ICD-10-CM

## 2019-02-26 DIAGNOSIS — F331 Major depressive disorder, recurrent, moderate: Secondary | ICD-10-CM | POA: Diagnosis not present

## 2019-02-26 DIAGNOSIS — Z23 Encounter for immunization: Secondary | ICD-10-CM | POA: Diagnosis not present

## 2019-02-26 MED ORDER — DESVENLAFAXINE SUCCINATE ER 50 MG PO TB24: 50.0000 mg | ORAL_TABLET | Freq: Every day | ORAL | 3 refills | Status: DC

## 2019-02-26 NOTE — Patient Instructions (Signed)
Nathan Blake  [phone] 

## 2019-02-26 NOTE — Progress Notes (Signed)
Patient: Ronald M Martinique Jr. Male    DOB: 1960-10-31   58 y.o.   MRN: [id number] Visit Date: 02/26/2019  Today's Provider: Wilhemena Durie, MD   Chief Complaint  Patient presents with  . Depression   Subjective:     HPI Patient states that depression has gotten worse over the last 11 months with injury to ankle and ruptured disc in back. He states that he has been unable to do any physical activity due to the injuries. He stopped taking Gabapentin and pain medications because of depression, resulting in suicidal thoughts. He would like to discuss restarting medications. Requesting a referral for psychiatry. Patient states he stopped drinking 3 months ago.  Allergies  Allergen Reactions  . Venlafaxine     panic atack/anxiety/"flu like" symptoms-severe     Current Outpatient Medications:  .  ALPRAZolam (XANAX) 0.5 MG tablet, TAKE ONE TABLET EVERY 8 HOURS AS NEEDED FOR ANXIETY., Disp: 60 tablet, Rfl: 0 .  cyclobenzaprine (FLEXERIL) 10 MG tablet, Take 1 tablet (10 mg total) by mouth at bedtime., Disp: 30 tablet, Rfl: 3 .  RABEprazole (ACIPHEX) 20 MG tablet, Take 1 tablet (20 mg total) daily by mouth., Disp: 30 tablet, Rfl: 11 .  tamsulosin (FLOMAX) 0.4 MG CAPS capsule, Take 1 capsule (0.4 mg total) by mouth daily. (Patient taking differently: Take 0.4 mg by mouth daily after supper. ), Disp: 30 capsule, Rfl: 11 .  desvenlafaxine (PRISTIQ) 50 MG 24 hr tablet, Take 1 tablet (50 mg total) by mouth daily. (Patient not taking: Reported on 05/07/2018), Disp: 90 tablet, Rfl: 3 .  gabapentin (NEURONTIN) 400 MG capsule, Take 1 capsule (400 mg total) by mouth 3 (three) times daily. (Patient not taking: Reported on 02/26/2019), Disp: 60 capsule, Rfl: 3 .  HYDROcodone-acetaminophen (NORCO) 7.5-325 MG tablet, Take 1 tablet by mouth every 6 (six) hours as needed for moderate pain. (Patient not taking: Reported on 02/26/2019), Disp: 50 tablet, Rfl: 0 .  meloxicam (MOBIC) 15 MG tablet, Take 1  tablet (15 mg total) by mouth daily. (Patient not taking: Reported on 02/26/2019), Disp: 30 tablet, Rfl: 3 .  oxyCODONE-acetaminophen (PERCOCET) 5-325 MG tablet, Take 1 tablet by mouth every 4 (four) hours as needed for severe pain. (Patient not taking: Reported on 02/26/2019), Disp: 20 tablet, Rfl: 0 .  Probiotic Product (PROBIOTIC DAILY PO), Take 1 capsule by mouth daily. , Disp: , Rfl:   Review of Systems  Constitutional: Positive for fatigue.  HENT: Negative.   Eyes: Negative.   Respiratory: Negative.   Cardiovascular: Negative.   Gastrointestinal: Negative.   Endocrine: Negative.   Genitourinary: Negative.   Musculoskeletal: Negative.   Skin: Negative.   Allergic/Immunologic: Negative.   Neurological: Negative.   Hematological: Negative.   Psychiatric/Behavioral: Positive for dysphoric mood.       He admits to suicidal ideation.    Social History   Tobacco Use  . Smoking status: Never Smoker  . Smokeless tobacco: Current User    Types: Chew  Substance Use Topics  . Alcohol use: Yes    Alcohol/week: 3.0 standard drinks    Types: 1 Glasses of wine, 2 Cans of beer per week    Comment: WINE AND BEER OCC      Objective:   BP (!) 128/98 (BP Location: Right Arm, Patient Position: Sitting, Cuff Size: Large)   Pulse 96   Temp 98.1 F (36.7 C) (Temporal)   Resp 18   Wt 196 lb 9.6 oz (89.2 kg)  SpO2 98%   BMI 30.34 kg/m  Vitals:   02/26/19 1531  BP: (!) 128/98  Pulse: 96  Resp: 18  Temp: 98.1 F (36.7 C)  TempSrc: Temporal  SpO2: 98%  Weight: 196 lb 9.6 oz (89.2 kg)  Body mass index is 30.34 kg/m.   Physical Exam Vitals signs reviewed.  Constitutional:      Appearance: He is well-developed.  HENT:     Head: Normocephalic and atraumatic.  Eyes:     Conjunctiva/sclera: Conjunctivae normal.     Pupils: Pupils are equal, round, and reactive to light.  Neck:     Musculoskeletal: Normal range of motion and neck supple.     Thyroid: No thyromegaly.   Cardiovascular:     Rate and Rhythm: Normal rate and regular rhythm.     Heart sounds: Normal heart sounds.  Pulmonary:     Effort: Pulmonary effort is normal.     Breath sounds: Normal breath sounds.  Abdominal:     Palpations: Abdomen is soft.  Musculoskeletal: Normal range of motion.  Skin:    General: Skin is warm and dry.  Neurological:     General: No focal deficit present.     Mental Status: He is alert and oriented to person, place, and time.     Deep Tendon Reflexes: Reflexes are normal and symmetric.  Psychiatric:        Mood and Affect: Mood normal.        Behavior: Behavior normal.        Thought Content: Thought content normal.        Judgment: Judgment normal.      No results found for any visits on 02/26/19.     Assessment & Plan    1. Need for immunization against influenza  - Flu Vaccine QUAD 6+ mos PF IM (Fluarix Quad PF)  2. Moderate episode of recurrent major depressive disorder (HCC) Pt agrees he will not harm himself and says he a great support group through his wife and children. He does not own a gun. He will restart Pristiq 50mg  daily and will call Ramond Dialnathan Blake for an appt.More than 50% 25 minute visit spent in counseling or coordination of care. RTC 1-2 weeks. I would like to refer to Psychiatry--he wishes to wait as pristiq worked before. \ 3.Sciatica of left LS spine  Foot drop has resolved.    Richard Wendelyn BreslowGilbert Jr, MD  [facility]

## 2019-03-06 ENCOUNTER — Other Ambulatory Visit: Payer: Self-pay | Admitting: *Deleted

## 2019-03-06 DIAGNOSIS — Z20822 Contact with and (suspected) exposure to covid-19: Secondary | ICD-10-CM

## 2019-03-07 LAB — NOVEL CORONAVIRUS, NAA: SARS-CoV-2, NAA: NOT DETECTED

## 2019-03-12 ENCOUNTER — Other Ambulatory Visit: Payer: Self-pay

## 2019-03-12 ENCOUNTER — Encounter: Payer: Self-pay | Admitting: Family Medicine

## 2019-03-12 ENCOUNTER — Ambulatory Visit (INDEPENDENT_AMBULATORY_CARE_PROVIDER_SITE_OTHER): Payer: BLUE CROSS/BLUE SHIELD | Admitting: Family Medicine

## 2019-03-12 VITALS — BP 139/88 | HR 76 | Temp 97.7°F | Resp 18 | Wt 195.4 lb

## 2019-03-12 DIAGNOSIS — F331 Major depressive disorder, recurrent, moderate: Secondary | ICD-10-CM | POA: Diagnosis not present

## 2019-03-12 DIAGNOSIS — F909 Attention-deficit hyperactivity disorder, unspecified type: Secondary | ICD-10-CM

## 2019-03-12 DIAGNOSIS — F419 Anxiety disorder, unspecified: Secondary | ICD-10-CM | POA: Diagnosis not present

## 2019-03-12 NOTE — Progress Notes (Signed)
Patient: Ronald M Martinique Jr. Male    DOB: [birth year]   58 y.o.   MRN: [id number] Visit Date: 03/12/2019  Today's Provider: Wilhemena Durie, MD   Chief Complaint  Patient presents with  . Depression   Subjective:     HPI 2 week follow up for depression. Patient states that he experienced diarrhea after taking the Pristiq. He states that he had a stomach virus for one week, but started back taking a half of tablet, and then the diarrhea started back.   Allergies  Allergen Reactions  . Venlafaxine     panic atack/anxiety/"flu like" symptoms-severe     Current Outpatient Medications:  .  ALPRAZolam (XANAX) 0.5 MG tablet, TAKE ONE TABLET EVERY 8 HOURS AS NEEDED FOR ANXIETY., Disp: 60 tablet, Rfl: 0 .  cyclobenzaprine (FLEXERIL) 10 MG tablet, Take 1 tablet (10 mg total) by mouth at bedtime., Disp: 30 tablet, Rfl: 3 .  desvenlafaxine (PRISTIQ) 50 MG 24 hr tablet, Take 1 tablet (50 mg total) by mouth daily. (Patient not taking: Reported on 05/07/2018), Disp: 90 tablet, Rfl: 3 .  desvenlafaxine (PRISTIQ) 50 MG 24 hr tablet, Take 1 tablet (50 mg total) by mouth daily., Disp: 30 tablet, Rfl: 3 .  gabapentin (NEURONTIN) 400 MG capsule, Take 1 capsule (400 mg total) by mouth 3 (three) times daily. (Patient not taking: Reported on 02/26/2019), Disp: 60 capsule, Rfl: 3 .  HYDROcodone-acetaminophen (NORCO) 7.5-325 MG tablet, Take 1 tablet by mouth every 6 (six) hours as needed for moderate pain. (Patient not taking: Reported on 02/26/2019), Disp: 50 tablet, Rfl: 0 .  meloxicam (MOBIC) 15 MG tablet, Take 1 tablet (15 mg total) by mouth daily. (Patient not taking: Reported on 02/26/2019), Disp: 30 tablet, Rfl: 3 .  oxyCODONE-acetaminophen (PERCOCET) 5-325 MG tablet, Take 1 tablet by mouth every 4 (four) hours as needed for severe pain. (Patient not taking: Reported on 02/26/2019), Disp: 20 tablet, Rfl: 0 .  Probiotic Product (PROBIOTIC DAILY PO), Take 1 capsule by mouth daily. , Disp: , Rfl:  .   RABEprazole (ACIPHEX) 20 MG tablet, Take 1 tablet (20 mg total) daily by mouth., Disp: 30 tablet, Rfl: 11 .  tamsulosin (FLOMAX) 0.4 MG CAPS capsule, Take 1 capsule (0.4 mg total) by mouth daily. (Patient taking differently: Take 0.4 mg by mouth daily after supper. ), Disp: 30 capsule, Rfl: 11  Review of Systems  Constitutional: Negative.   HENT: Negative.   Eyes: Negative.   Respiratory: Negative.   Cardiovascular: Negative.   Gastrointestinal: Negative.   Endocrine: Negative.   Genitourinary: Negative.   Skin: Negative.   Allergic/Immunologic: Negative.   Neurological: Negative.   Hematological: Negative.   Psychiatric/Behavioral: Negative.     Social History   Tobacco Use  . Smoking status: Never Smoker  . Smokeless tobacco: Current User    Types: Chew  Substance Use Topics  . Alcohol use: Yes    Alcohol/week: 3.0 standard drinks    Types: 1 Glasses of wine, 2 Cans of beer per week    Comment: WINE AND BEER OCC      Objective:   BP 139/88 (BP Location: Right Arm, Patient Position: Sitting, Cuff Size: Normal)   Pulse 76   Temp 97.7 F (36.5 C) (Temporal)   Resp 18   Wt 195 lb 6.4 oz (88.6 kg)   SpO2 98%   BMI 30.15 kg/m  Vitals:   03/12/19 1545  BP: 139/88  Pulse: 76  Resp: 18  Temp: 97.7 F (36.5 C)  TempSrc: Temporal  SpO2: 98%  Weight: 195 lb 6.4 oz (88.6 kg)  Body mass index is 30.15 kg/m.   Physical Exam Vitals signs reviewed.  Constitutional:      Appearance: He is well-developed.  HENT:     Head: Normocephalic and atraumatic.     Right Ear: External ear normal.     Left Ear: External ear normal.     Nose: Nose normal.  Eyes:     Conjunctiva/sclera: Conjunctivae normal.     Pupils: Pupils are equal, round, and reactive to light.  Neck:     Musculoskeletal: Normal range of motion and neck supple.  Cardiovascular:     Rate and Rhythm: Normal rate and regular rhythm.     Heart sounds: Normal heart sounds.  Pulmonary:     Effort:  Pulmonary effort is normal.     Breath sounds: Normal breath sounds.  Abdominal:     General: Bowel sounds are normal.     Palpations: Abdomen is soft.  Skin:    General: Skin is warm and dry.  Neurological:     General: No focal deficit present.     Mental Status: He is alert and oriented to person, place, and time.  Psychiatric:        Mood and Affect: Mood normal.        Behavior: Behavior normal.        Thought Content: Thought content normal.        Judgment: Judgment normal.      No results found for any visits on 03/12/19.     Assessment & Plan    1. Moderate episode of recurrent major depressive disorder (HCC) Improved with counseling. Restart Pristiq and RTC 1 month. 2. Anxiety   3. Attention deficit hyperactivity disorder (ADHD), unspecified ADHD type      Megan Mans, MD  [facility]

## 2019-03-12 NOTE — Patient Instructions (Signed)
Restart Pristiq

## 2019-03-18 ENCOUNTER — Telehealth: Payer: Self-pay | Admitting: Family Medicine

## 2019-03-18 NOTE — Telephone Encounter (Signed)
Please review. Thanks!  

## 2019-03-18 NOTE — Telephone Encounter (Signed)
Patient came by the office stating he was having side effect of diarrhea and no appetitie from the Desvenlafaxine. He talked the pharmacist at [facility] and she suggested he cut back to a 25 mg. Pill.   Can you call this in for him to [facility].

## 2019-03-19 ENCOUNTER — Other Ambulatory Visit: Payer: Self-pay

## 2019-03-19 MED ORDER — DESVENLAFAXINE SUCCINATE ER 25 MG PO TB24: 25.0000 mg | ORAL_TABLET | Freq: Every day | ORAL | 1 refills | Status: DC

## 2019-03-19 NOTE — Telephone Encounter (Signed)
Ok to send in this dose.

## 2019-03-19 NOTE — Telephone Encounter (Signed)
Medication sent to pharmacy. Left voicemail notifying patient.

## 2019-04-08 ENCOUNTER — Other Ambulatory Visit: Payer: Self-pay | Admitting: Family Medicine

## 2019-04-09 NOTE — Progress Notes (Signed)
Patient: Ronald M Martinique Jr. Male    DOB: July 16, 1960   58 y.o.   MRN: [id number] Visit Date: 04/13/2019  Today's Provider: Wilhemena Durie, MD   Chief Complaint  Patient presents with  . Depression  . Anxiety  . ADHD   Subjective:   HPI   Moderate episode of recurrent major depressive disorder ([place]) From 03/12/2019-Improved with counseling. Restarted Pristiq and RTC 1 month. However, patient reports that he has not been taking this consistently. He stopped it but is feeling better. He is seeing Eugenia Pancoast,  Anxiety From 03/12/2019-currently taking alprazolam (XANAX) 0.5 MG tablet, take one tablet every 8 hours as needed for anxiety.   Attention deficit hyperactivity disorder (ADHD), unspecified ADHD type From 03/12/2019- Stable.   Patient also mentions that he is currently on steroids due to just having tubes placed in his ears about 3 days ago. He reports that he will finish his treatment this week.    Allergies  Allergen Reactions  . Venlafaxine     panic atack/anxiety/"flu like" symptoms-severe     Current Outpatient Medications:  .  ALPRAZolam (XANAX) 0.5 MG tablet, TAKE ONE TABLET EVERY 8 HOURS AS NEEDED FOR ANXIETY., Disp: 90 tablet, Rfl: 2 .  Probiotic Product (PROBIOTIC DAILY PO), Take 1 capsule by mouth daily. , Disp: , Rfl:  .  RABEprazole (ACIPHEX) 20 MG tablet, Take 1 tablet (20 mg total) daily by mouth., Disp: 30 tablet, Rfl: 11 .  tamsulosin (FLOMAX) 0.4 MG CAPS capsule, Take 1 capsule (0.4 mg total) by mouth daily. (Patient taking differently: Take 0.4 mg by mouth daily after supper. ), Disp: 30 capsule, Rfl: 11 .  cyclobenzaprine (FLEXERIL) 10 MG tablet, Take 1 tablet (10 mg total) by mouth at bedtime. (Patient not taking: Reported on 04/13/2019), Disp: 30 tablet, Rfl: 3 .  desvenlafaxine (PRISTIQ) 50 MG 24 hr tablet, Take 1 tablet (50 mg total) by mouth daily. (Patient not taking: Reported on 04/13/2019), Disp: 30 tablet, Rfl: 3 .  Desvenlafaxine  Succinate ER 25 MG TB24, Take 25 mg by mouth daily. (Patient not taking: Reported on 04/13/2019), Disp: 60 tablet, Rfl: 1 .  gabapentin (NEURONTIN) 400 MG capsule, Take 1 capsule (400 mg total) by mouth 3 (three) times daily. (Patient not taking: Reported on 02/26/2019), Disp: 60 capsule, Rfl: 3 .  HYDROcodone-acetaminophen (NORCO) 7.5-325 MG tablet, Take 1 tablet by mouth every 6 (six) hours as needed for moderate pain. (Patient not taking: Reported on 02/26/2019), Disp: 50 tablet, Rfl: 0 .  meloxicam (MOBIC) 15 MG tablet, Take 1 tablet (15 mg total) by mouth daily. (Patient not taking: Reported on 02/26/2019), Disp: 30 tablet, Rfl: 3 .  oxyCODONE-acetaminophen (PERCOCET) 5-325 MG tablet, Take 1 tablet by mouth every 4 (four) hours as needed for severe pain. (Patient not taking: Reported on 02/26/2019), Disp: 20 tablet, Rfl: 0  Review of Systems  Constitutional: Negative for appetite change, chills and fever.  Eyes: Negative.   Respiratory: Negative for chest tightness, shortness of breath and wheezing.   Cardiovascular: Negative for chest pain and palpitations.  Gastrointestinal: Negative for abdominal pain, nausea and vomiting.  Endocrine: Negative.   Musculoskeletal: Positive for back pain.  Allergic/Immunologic: Negative.   Psychiatric/Behavioral: The patient is nervous/anxious.        Depressed mood better per pt.    Social History   Tobacco Use  . Smoking status: Never Smoker  . Smokeless tobacco: Current User    Types: Chew  Substance Use Topics  .  Alcohol use: Yes    Alcohol/week: 3.0 standard drinks    Types: 1 Glasses of wine, 2 Cans of beer per week    Comment: WINE AND BEER OCC      Objective:   BP 130/62   Pulse 76   Temp 98.1 F (36.7 C)   Resp 16   Ht 5' 7.5" (1.715 m)   Wt 190 lb (86.2 kg)   SpO2 97%   BMI 29.32 kg/m  Vitals:   04/13/19 1100  BP: 130/62  Pulse: 76  Resp: 16  Temp: 98.1 F (36.7 C)  SpO2: 97%  Weight: 190 lb (86.2 kg)  Height: 5' 7.5"  (1.715 m)  Body mass index is 29.32 kg/m.   Physical Exam   No results found for any visits on 04/13/19.     Assessment & Plan    1. Moderate episode of recurrent major depressive disorder (HCC) Pt wishes no change in present meds--he is not taking Pristiq. Continue counseling   2. Attention deficit hyperactivity disorder (ADHD), unspecified ADHD type Per psychiatry.  3. Anxiety Improved. 4.Back Pain Sees surgeon later this week. 5.Myringotomy Tubes Placed this past week.    I, Rachelle L. Presley, CMA, am acting as a Neurosurgeon for [employer]. Sullivan Lone, MD.    Megan Mans, MD  [facility]

## 2019-04-13 ENCOUNTER — Encounter: Payer: Self-pay | Admitting: Family Medicine

## 2019-04-13 ENCOUNTER — Other Ambulatory Visit: Payer: Self-pay

## 2019-04-13 ENCOUNTER — Ambulatory Visit (INDEPENDENT_AMBULATORY_CARE_PROVIDER_SITE_OTHER): Payer: BLUE CROSS/BLUE SHIELD | Admitting: Family Medicine

## 2019-04-13 VITALS — BP 130/62 | HR 76 | Temp 98.1°F | Resp 16 | Ht 67.5 in | Wt 190.0 lb

## 2019-04-13 DIAGNOSIS — F419 Anxiety disorder, unspecified: Secondary | ICD-10-CM | POA: Diagnosis not present

## 2019-04-13 DIAGNOSIS — F331 Major depressive disorder, recurrent, moderate: Secondary | ICD-10-CM

## 2019-04-13 DIAGNOSIS — F909 Attention-deficit hyperactivity disorder, unspecified type: Secondary | ICD-10-CM | POA: Diagnosis not present

## 2019-04-13 DIAGNOSIS — M5432 Sciatica, left side: Secondary | ICD-10-CM | POA: Diagnosis not present

## 2019-04-20 ENCOUNTER — Other Ambulatory Visit: Payer: Self-pay | Admitting: Family Medicine

## 2019-04-20 DIAGNOSIS — N400 Enlarged prostate without lower urinary tract symptoms: Secondary | ICD-10-CM

## 2019-04-22 NOTE — Progress Notes (Signed)
Patient: Ronald M SwazilandJordan Jr., Male    DOB: Oct 17, 1960, 58 y.o.   MRN: [id number] Visit Date: 04/23/2019  Today's Provider: Megan Mansichard  Jr, MD   Chief Complaint  Patient presents with  . Annual Exam   Subjective:     Annual physical exam Ronald M Swazil[place]Jordan Jr. is a 58 y.o. male who presents today for health maintenance and complete physical. He feels well. He reports exercising none. He reports he is sleeping fairly well.  -----------------------------------------------------------   Review of Systems  Constitutional: Negative.   Eyes: Negative.   Respiratory: Negative.   Cardiovascular: Negative.   Endocrine: Negative.   Genitourinary: Negative.   Musculoskeletal: Positive for back pain and neck stiffness.  Allergic/Immunologic: Negative.   Neurological: Positive for numbness.  Hematological: Negative.   Psychiatric/Behavioral:       Patient states he did not did not like the way Pristiq made him feel so he stopped it.  He states he is however 75% better with the depression with counseling with  Ramond DialNathan Blake    Social History      He  reports that he has never smoked. His smokeless tobacco use includes chew. He reports current alcohol use of about 3.0 standard drinks of alcohol per week. He reports that he does not use drugs.       Social History   Socioeconomic History  . Marital status: Divorced    Spouse name: Not on file  . Number of children: Not on file  . Years of education: Not on file  . Highest education level: Not on file  Occupational History  . Not on file  Social Needs  . Financial resource strain: Not on file  . Food insecurity    Worry: Not on file    Inability: Not on file  . Transportation needs    Medical: Not on file    Non-medical: Not on file  Tobacco Use  . Smoking status: Never Smoker  . Smokeless tobacco: Current User    Types: Chew  Substance and Sexual Activity  . Alcohol use: Yes    Alcohol/week: 3.0 standard drinks   Types: 1 Glasses of wine, 2 Cans of beer per week    Comment: WINE AND BEER OCC  . Drug use: No  . Sexual activity: Never  Lifestyle  . Physical activity    Days per week: Not on file    Minutes per session: Not on file  . Stress: Not on file  Relationships  . Social Musicianconnections    Talks on phone: Not on file    Gets together: Not on file    Attends religious service: Not on file    Active member of club or organization: Not on file    Attends meetings of clubs or organizations: Not on file    Relationship status: Not on file  Other Topics Concern  . Not on file  Social History Narrative  . Not on file    Past Medical History:  Diagnosis Date  . Complication of anesthesia   . Depressive disorder   . ED (erectile dysfunction)   . Enthesopathy   . GERD (gastroesophageal reflux disease)   . H/O measles   . History of dysuria   . History of hiatal hernia    SMALL   . Hypercholesterolemia   . Meralgia paraesthetica   . Numbness of anterior thigh   . Pharyngitis, acute   . PONV (postoperative nausea and vomiting)  AFTER HERNIA SURGERY  . Sinusitis   . Tension headache   . Vitamin D deficiency      Patient Active Problem List   Diagnosis Date Noted  . ADD (attention deficit disorder) 12/17/2014  . Anxiety 12/17/2014  . Acid reflux 12/17/2014  . ED (erectile dysfunction) of organic origin 12/17/2014  . Abdominal hernia 12/17/2014  . Avitaminosis D 12/17/2014  . BPH (benign prostatic hyperplasia) 09/29/2013  . Near syncope 09/29/2013  . Palpitations 11/25/2012  . Fatigue 11/25/2012  . Hyperlipidemia 11/25/2012  . Clinical depression 08/15/2009  . Esophagitis, reflux 12/05/2006  . Tobacco use 12/05/2006    Past Surgical History:  Procedure Laterality Date  . ACHILLES TENDON SURGERY Left 05/12/2018   Procedure: ACHILLES TENDON REPAIR;  Surgeon: Earnestine Leys, MD;  Location: [facility] ORS;  Service: Orthopedics;  Laterality: Left;  . BICEPS TENDON REPAIR Right  07/16/2017  . HERNIA REPAIR    . LAPAROSCOPY     diverticulitis  . MYRINGOTOMY    . NASAL SEPTUM SURGERY      Family History        Family Status  Relation Name Status  . Father  Alive  . Mother  Alive       CHF  . Sister  Alive  . Brother  Alive  . Daughter  Alive  . Son  Alive  . Son  Alive  . Sister  Alive  . Annamarie Major  (Not Specified)  . MGF  (Not Specified)  . PGF  (Not Specified)        His family history includes Arthritis in his father and mother; Cancer in his father, paternal grandfather, and paternal uncle; Congestive Heart Failure in his mother; Heart attack (age of onset: 69) in his father; Heart disease in his father and maternal grandfather.      Allergies  Allergen Reactions  . Venlafaxine     panic atack/anxiety/"flu like" symptoms-severe     Current Outpatient Medications:  .  ALPRAZolam (XANAX) 0.5 MG tablet, TAKE ONE TABLET EVERY 8 HOURS AS NEEDED FOR ANXIETY., Disp: 90 tablet, Rfl: 2 .  Probiotic Product (PROBIOTIC DAILY PO), Take 1 capsule by mouth daily. , Disp: , Rfl:  .  RABEprazole (ACIPHEX) 20 MG tablet, Take 1 tablet (20 mg total) daily by mouth., Disp: 30 tablet, Rfl: 11 .  tamsulosin (FLOMAX) 0.4 MG CAPS capsule, Take 1 capsule (0.4 mg total) by mouth daily., Disp: 30 capsule, Rfl: 11 .  cyclobenzaprine (FLEXERIL) 10 MG tablet, Take 1 tablet (10 mg total) by mouth at bedtime. (Patient not taking: Reported on 04/13/2019), Disp: 30 tablet, Rfl: 3 .  desvenlafaxine (PRISTIQ) 50 MG 24 hr tablet, Take 1 tablet (50 mg total) by mouth daily. (Patient not taking: Reported on 04/13/2019), Disp: 30 tablet, Rfl: 3 .  Desvenlafaxine Succinate ER 25 MG TB24, Take 25 mg by mouth daily. (Patient not taking: Reported on 04/13/2019), Disp: 60 tablet, Rfl: 1 .  gabapentin (NEURONTIN) 400 MG capsule, Take 1 capsule (400 mg total) by mouth 3 (three) times daily. (Patient not taking: Reported on 02/26/2019), Disp: 60 capsule, Rfl: 3 .  HYDROcodone-acetaminophen  (NORCO) 7.5-325 MG tablet, Take 1 tablet by mouth every 6 (six) hours as needed for moderate pain. (Patient not taking: Reported on 02/26/2019), Disp: 50 tablet, Rfl: 0 .  meloxicam (MOBIC) 15 MG tablet, Take 1 tablet (15 mg total) by mouth daily. (Patient not taking: Reported on 02/26/2019), Disp: 30 tablet, Rfl: 3 .  oxyCODONE-acetaminophen (PERCOCET) 5-325 MG tablet,  Take 1 tablet by mouth every 4 (four) hours as needed for severe pain. (Patient not taking: Reported on 02/26/2019), Disp: 20 tablet, Rfl: 0   Patient Care Team: Maple Hudson., MD as PCP - General (Family Medicine)    Objective:    Vitals: BP 100/74 (BP Location: Left Arm, Patient Position: Sitting, Cuff Size: Large)   Pulse 65   Temp (!) 96.6 F (35.9 C) (Other (Comment))   Resp 18   Ht 5\' 8"  (1.727 m)   Wt 196 lb (88.9 kg)   SpO2 97%   BMI 29.80 kg/m    Vitals:   04/23/19 1037  BP: 100/74  Pulse: 65  Resp: 18  Temp: (!) 96.6 F (35.9 C)  TempSrc: Other (Comment)  SpO2: 97%  Weight: 196 lb (88.9 kg)  Height: 5\' 8"  (1.727 m)     Physical Exam Vitals signs reviewed.  Constitutional:      Appearance: He is well-developed.  HENT:     Head: Normocephalic and atraumatic.     Right Ear: External ear normal.     Left Ear: External ear normal.     Nose: Nose normal.  Eyes:     Conjunctiva/sclera: Conjunctivae normal.     Pupils: Pupils are equal, round, and reactive to light.  Neck:     Musculoskeletal: Normal range of motion and neck supple.  Cardiovascular:     Rate and Rhythm: Normal rate and regular rhythm.     Heart sounds: Normal heart sounds.  Pulmonary:     Effort: Pulmonary effort is normal.     Breath sounds: Normal breath sounds.  Abdominal:     General: Bowel sounds are normal.     Palpations: Abdomen is soft.  Genitourinary:    Penis: Normal.      Scrotum/Testes: Normal.     Prostate: Normal.     Rectum: Normal.  Musculoskeletal: Normal range of motion.  Skin:    General: Skin  is warm and dry.  Neurological:     Mental Status: He is alert and oriented to person, place, and time. Mental status is at baseline.  Psychiatric:        Mood and Affect: Mood normal.        Behavior: Behavior normal.        Thought Content: Thought content normal.        Judgment: Judgment normal.      Depression Screen PHQ 2/9 Scores 04/23/2019 04/08/2018 09/09/2017 03/12/2017  PHQ - 2 Score 5 5 6 6   PHQ- 9 Score 15 16 18 13        Assessment & Plan:     Routine Health Maintenance and Physical Exam  Exercise Activities and Dietary recommendations Goals   None     Immunization History  Administered Date(s) Administered  . Influenza,inj,Quad PF,6+ Mos 02/22/2015, 03/12/2017, 04/08/2018, 02/26/2019  . Tdap 07/25/2010    Health Maintenance  Topic Date Due  . HIV Screening  01/28/1975  . TETANUS/TDAP  07/25/2020  . COLONOSCOPY  06/28/2027  . INFLUENZA VACCINE  Completed  . Hepatitis C Screening  Completed     Discussed health benefits of physical activity, and encouraged him to engage in regular exercise appropriate for his age and condition.    --------------------------------------------------------------------  1. Annual physical exam  - TSH - Lipid panel - CBC w/Diff/Platelet - Comprehensive Metabolic Panel (CMET) - PSA - POCT urinalysis dipstick  2. Prostate cancer screening  - TSH - Lipid panel - CBC w/Diff/Platelet -  Comprehensive Metabolic Panel (CMET) - PSA - POCT urinalysis dipstick  3. Moderate episode of recurrent major depressive disorder (HCC)  - TSH - Lipid panel - CBC w/Diff/Platelet - Comprehensive Metabolic Panel (CMET) - PSA - POCT urinalysis dipstick  4. Anxiety  - TSH - Lipid panel - CBC w/Diff/Platelet - Comprehensive Metabolic Panel (CMET) - PSA - POCT urinalysis dipstick 5.Chronic Back Pain Surgeon has suggested treating with physical therapy.  No surgery necessary.  Follow up in January for Depression.     I,April Miller,acting as a scribe for Megan Mans, MD.,have documented all relevant documentation on the behalf of Megan Mans, MD,as directed by  Megan Mans, MD while in the presence of Megan Mans, MD.    Megan Mans, MD  [facility]

## 2019-04-23 ENCOUNTER — Ambulatory Visit (INDEPENDENT_AMBULATORY_CARE_PROVIDER_SITE_OTHER): Payer: BLUE CROSS/BLUE SHIELD | Admitting: Family Medicine

## 2019-04-23 ENCOUNTER — Other Ambulatory Visit: Payer: Self-pay

## 2019-04-23 ENCOUNTER — Encounter: Payer: Self-pay | Admitting: Family Medicine

## 2019-04-23 VITALS — BP 100/74 | HR 65 | Temp 96.6°F | Resp 18 | Ht 68.0 in | Wt 196.0 lb

## 2019-04-23 DIAGNOSIS — Z0000 Encounter for general adult medical examination without abnormal findings: Secondary | ICD-10-CM | POA: Diagnosis not present

## 2019-04-23 DIAGNOSIS — Z125 Encounter for screening for malignant neoplasm of prostate: Secondary | ICD-10-CM | POA: Diagnosis not present

## 2019-04-23 DIAGNOSIS — F331 Major depressive disorder, recurrent, moderate: Secondary | ICD-10-CM | POA: Diagnosis not present

## 2019-04-23 DIAGNOSIS — M5432 Sciatica, left side: Secondary | ICD-10-CM

## 2019-04-23 DIAGNOSIS — F419 Anxiety disorder, unspecified: Secondary | ICD-10-CM | POA: Diagnosis not present

## 2019-04-23 LAB — POCT URINALYSIS DIPSTICK
Appearance: NORMAL
Bilirubin, UA: NEGATIVE
Blood, UA: NEGATIVE
Glucose, UA: NEGATIVE
Ketones, UA: NEGATIVE
Leukocytes, UA: NEGATIVE
Nitrite, UA: NEGATIVE
Odor: NORMAL
Protein, UA: NEGATIVE
Spec Grav, UA: 1.01 (ref 1.010–1.025)
Urobilinogen, UA: 0.2 E.U./dL
pH, UA: 7.5 (ref 5.0–8.0)

## 2019-04-24 LAB — COMPREHENSIVE METABOLIC PANEL
ALT: 33 IU/L (ref 0–44)
AST: 23 IU/L (ref 0–40)
Albumin/Globulin Ratio: 1.8 (ref 1.2–2.2)
Albumin: 4.3 g/dL (ref 3.8–4.9)
Alkaline Phosphatase: 80 IU/L (ref 39–117)
BUN/Creatinine Ratio: 9 (ref 9–20)
BUN: 12 mg/dL (ref 6–24)
Bilirubin Total: 0.4 mg/dL (ref 0.0–1.2)
CO2: 21 mmol/L (ref 20–29)
Calcium: 9.1 mg/dL (ref 8.7–10.2)
Chloride: 103 mmol/L (ref 96–106)
Creatinine, Ser: 1.28 mg/dL — ABNORMAL HIGH (ref 0.76–1.27)
GFR calc Af Amer: 70 mL/min/{1.73_m2} (ref >59)
GFR calc non Af Amer: 61 mL/min/{1.73_m2} (ref >59)
Globulin, Total: 2.4 g/dL (ref 1.5–4.5)
Glucose: 89 mg/dL (ref 65–99)
Potassium: 4.5 mmol/L (ref 3.5–5.2)
Sodium: 139 mmol/L (ref 134–144)
Total Protein: 6.7 g/dL (ref 6.0–8.5)

## 2019-04-24 LAB — CBC WITH DIFFERENTIAL/PLATELET
Basophils Absolute: 0.1 10*3/uL (ref 0.0–0.2)
Basos: 2 %
EOS (ABSOLUTE): 0.2 10*3/uL (ref 0.0–0.4)
Eos: 3 %
Hematocrit: 47.2 % (ref 37.5–51.0)
Hemoglobin: 16.4 g/dL (ref 13.0–17.7)
Immature Grans (Abs): 0.1 10*3/uL (ref 0.0–0.1)
Immature Granulocytes: 2 %
Lymphocytes Absolute: 1.8 10*3/uL (ref 0.7–3.1)
Lymphs: 25 %
MCH: 30.4 pg (ref 26.6–33.0)
MCHC: 34.7 g/dL (ref 31.5–35.7)
MCV: 87 fL (ref 79–97)
Monocytes Absolute: 0.9 10*3/uL (ref 0.1–0.9)
Monocytes: 12 %
Neutrophils Absolute: 4.3 10*3/uL (ref 1.4–7.0)
Neutrophils: 56 %
Platelets: 249 10*3/uL (ref 150–450)
RBC: 5.4 x10E6/uL (ref 4.14–5.80)
RDW: 13.5 % (ref 11.6–15.4)
WBC: 7.4 10*3/uL (ref 3.4–10.8)

## 2019-04-24 LAB — LIPID PANEL
Chol/HDL Ratio: 5.7 ratio — ABNORMAL HIGH (ref 0.0–5.0)
Cholesterol, Total: 257 mg/dL — ABNORMAL HIGH (ref 100–199)
HDL: 45 mg/dL (ref >39)
LDL Chol Calc (NIH): 147 mg/dL — ABNORMAL HIGH (ref 0–99)
Triglycerides: 355 mg/dL — ABNORMAL HIGH (ref 0–149)
VLDL Cholesterol Cal: 65 mg/dL — ABNORMAL HIGH (ref 5–40)

## 2019-04-24 LAB — TSH: TSH: 1.98 u[IU]/mL (ref 0.450–4.500)

## 2019-04-24 LAB — PSA: Prostate Specific Ag, Serum: 3 ng/mL (ref 0.0–4.0)

## 2019-04-27 ENCOUNTER — Encounter: Payer: Self-pay | Admitting: *Deleted

## 2019-05-20 ENCOUNTER — Other Ambulatory Visit: Payer: Self-pay | Admitting: Family Medicine

## 2019-05-20 DIAGNOSIS — K2100 Gastro-esophageal reflux disease with esophagitis, without bleeding: Secondary | ICD-10-CM

## 2019-05-20 NOTE — Telephone Encounter (Signed)
Requested Prescriptions  Pending Prescriptions Disp Refills  . RABEprazole (ACIPHEX) 20 MG tablet [Pharmacy Med Name: RABEPRAZOLE SOD DR 20 MG TAB] 30 tablet 0    Sig: Take 1 tablet (20 mg total) daily by mouth.     Gastroenterology: Proton Pump Inhibitors Passed - 05/20/2019  1:03 PM      Passed - Valid encounter within last 12 months    Recent Outpatient Visits          3 weeks ago Annual physical exam   [facility] Jerrol Banana., MD   1 month ago Moderate episode of recurrent major depressive disorder [facility])   [facility] Jerrol Banana., MD   2 months ago Moderate episode of recurrent major depressive disorder [facility])   [facility] Jerrol Banana., MD   2 months ago Need for immunization against influenza   [facility] Jerrol Banana., MD   1 year ago Annual physical exam   [facility] Jerrol Banana., MD      Future Appointments            In 1 month Jerrol Banana., MD [facility] [place]

## 2019-06-15 NOTE — Progress Notes (Signed)
Patient: Ronald M Martinique Jr. Male    DOB: December 25, 1960   58 y.o.   MRN: [id number] Visit Date: 06/23/2019  Today's Provider: Wilhemena Durie, MD   Chief Complaint  Patient presents with  . Depression  . Anxiety   Subjective:    HPI   Depression, Follow-up  He  was last seen for this 2 months ago. Changes made at last visit include no changes.   He reports excellent compliance with treatment. He is not having side effects.   He reports excellent tolerance of treatment. Current symptoms include: patient denies any symptoms He feels he is Improved since last visit.  Depression screen [facility] 2/9 06/23/2019 04/23/2019 04/08/2018 09/09/2017 03/12/2017  Decreased Interest 3 3 2 3 3   Down, Depressed, Hopeless 2 2 3 3 3   PHQ - 2 Score 5 5 5 6 6   Altered sleeping 0 3 2 3 3   Tired, decreased energy 0 2 3 3 2   Change in appetite 2 2 1 3  0  Feeling bad or failure about yourself  3 2 3 3 2   Trouble concentrating 0 0 0 0 0  Moving slowly or fidgety/restless 0 0 0 0 0  Suicidal thoughts 1 1 2  0 0  PHQ-9 Score 11 15 16 18 13   Difficult doing work/chores Somewhat difficult Somewhat difficult Very difficult Very difficult Somewhat difficult    ------------------------------------------------------------------------   Follow up for Anxiety  The patient was last seen for this 2 months ago. Changes made at last visit include no changes.  He reports excellent compliance with treatment. He feels that condition is Improved. He is not having side effects.   GAD 7 : Generalized Anxiety Score 06/23/2019  Nervous, Anxious, on Edge 0  Control/stop worrying 0  Worry too much - different things 0  Trouble relaxing 0  Restless 0  Easily annoyed or irritable 0  Afraid - awful might happen 0  Total GAD 7 Score 0  Anxiety Difficulty Not difficult at all     ------------------------------------------------------------------------------------   Allergies  Allergen Reactions  .  Venlafaxine     panic atack/anxiety/"flu like" symptoms-severe     Current Outpatient Medications:  .  ALPRAZolam (XANAX) 0.5 MG tablet, TAKE ONE TABLET EVERY 8 HOURS AS NEEDED FOR ANXIETY., Disp: 90 tablet, Rfl: 2 .  Probiotic Product (PROBIOTIC DAILY PO), Take 1 capsule by mouth daily. , Disp: , Rfl:  .  RABEprazole (ACIPHEX) 20 MG tablet, Take 1 tablet (20 mg total) daily by mouth., Disp: 30 tablet, Rfl: 0 .  tamsulosin (FLOMAX) 0.4 MG CAPS capsule, Take 1 capsule (0.4 mg total) by mouth daily., Disp: 30 capsule, Rfl: 11  ROS positive for anxiety,depression Not suicidal  Social History   Tobacco Use  . Smoking status: Never Smoker  . Smokeless tobacco: Current User    Types: Chew  Substance Use Topics  . Alcohol use: Yes    Alcohol/week: 3.0 standard drinks    Types: 1 Glasses of wine, 2 Cans of beer per week    Comment: WINE AND BEER OCC      Objective:   BP 128/84 (BP Location: Left Arm, Patient Position: Sitting, Cuff Size: Large)   Pulse 80   Temp (!) 97.1 F (36.2 C) (Temporal)   Resp 16   Ht 5\' 8"  (1.727 m)   Wt 197 lb 3.2 oz (89.4 kg)   BMI 29.98 kg/m  Vitals:   06/23/19 1509  BP: 128/84  Pulse: 80  Resp: 16  Temp: (!) 97.1 F (36.2 C)  TempSrc: Temporal  Weight: 197 lb 3.2 oz (89.4 kg)  Height: 5\' 8"  (1.727 m)  Body mass index is 29.98 kg/m.   Physical Exam Vitals reviewed.  Constitutional:      Appearance: He is well-developed.  HENT:     Head: Normocephalic and atraumatic.     Right Ear: External ear normal.     Left Ear: External ear normal.     Nose: Nose normal.  Eyes:     Conjunctiva/sclera: Conjunctivae normal.     Pupils: Pupils are equal, round, and reactive to light.  Cardiovascular:     Rate and Rhythm: Normal rate and regular rhythm.     Heart sounds: Normal heart sounds.  Pulmonary:     Effort: Pulmonary effort is normal.     Breath sounds: Normal breath sounds.  Abdominal:     General: Bowel sounds are normal.      Palpations: Abdomen is soft.  Musculoskeletal:     Cervical back: Normal range of motion and neck supple.  Skin:    General: Skin is warm and dry.  Neurological:     General: No focal deficit present.     Mental Status: He is alert and oriented to person, place, and time.  Psychiatric:        Mood and Affect: Mood normal.        Behavior: Behavior normal.        Thought Content: Thought content normal.        Judgment: Judgment normal.      No results found for any visits on 06/23/19.     Assessment & Plan    1. Gastroesophageal reflux disease with esophagitis, unspecified whether hemorrhage Refill aciphex. - RABEprazole (ACIPHEX) 20 MG tablet; Take 1 tablet (20 mg total) by mouth daily.  Dispense: 30 tablet; Refill: 12  2. Anxiety   3. Recurrent major depressive disorder, in partial remission (HCC) Increase pristiq.RTC 1 month.     Richard 08/21/19, MD  [facility]

## 2019-06-22 ENCOUNTER — Other Ambulatory Visit: Payer: Self-pay | Admitting: Family Medicine

## 2019-06-22 DIAGNOSIS — K2100 Gastro-esophageal reflux disease with esophagitis, without bleeding: Secondary | ICD-10-CM

## 2019-06-23 ENCOUNTER — Other Ambulatory Visit: Payer: Self-pay

## 2019-06-23 ENCOUNTER — Encounter: Payer: Self-pay | Admitting: Family Medicine

## 2019-06-23 ENCOUNTER — Ambulatory Visit (INDEPENDENT_AMBULATORY_CARE_PROVIDER_SITE_OTHER): Payer: BLUE CROSS/BLUE SHIELD | Admitting: Family Medicine

## 2019-06-23 VITALS — BP 128/84 | HR 80 | Temp 97.1°F | Resp 16 | Ht 68.0 in | Wt 197.2 lb

## 2019-06-23 DIAGNOSIS — K2100 Gastro-esophageal reflux disease with esophagitis, without bleeding: Secondary | ICD-10-CM | POA: Diagnosis not present

## 2019-06-23 DIAGNOSIS — F3341 Major depressive disorder, recurrent, in partial remission: Secondary | ICD-10-CM | POA: Diagnosis not present

## 2019-06-23 DIAGNOSIS — F419 Anxiety disorder, unspecified: Secondary | ICD-10-CM

## 2019-06-23 MED ORDER — RABEPRAZOLE SODIUM 20 MG PO TBEC: 20.0000 mg | DELAYED_RELEASE_TABLET | Freq: Every day | ORAL | 12 refills | Status: DC

## 2019-06-23 NOTE — Patient Instructions (Signed)
Restart Pristiq 50 mg daily.

## 2019-08-03 NOTE — Progress Notes (Signed)
Patient: Ronald M Martinique Jr. Male    DOB: [birth year]   59 y.o.   MRN: [id number] Visit Date: 08/04/2019  Today's Provider: Wilhemena Durie, MD   Chief Complaint  Patient presents with  . Depression  . Anxiety   Subjective:    I, Sulibeya S. Dimas, CMA, am acting as a Education administrator for [employer]. Rosanna Randy, MD.   HPI   Depression'Anxiety, Follow-up  He is seen Eugenia Pancoast weekly for counseling but continues to struggle.  He is not suicidal. Stephanie Coup.    He is divorced father of 3,all children are doing well. He  was last seen for this 6 weeks ago. Changes made at last visit include increase Pristiq.   He reports fair compliance with treatment. He is having side effects. Diarrhea, anxiousness, shakiness Patient reports he is taking Xanax 2-3 tablets daily. He reports fair to poor tolerance of treatment. Current symptoms include: depressed mood He feels he is Unchanged since last visit.  Depression screen [facility] 2/9 08/04/2019 06/23/2019 04/23/2019 04/08/2018 09/09/2017  Decreased Interest 3 3 3 2 3   Down, Depressed, Hopeless 2 2 2 3 3   PHQ - 2 Score 5 5 5 5 6   Altered sleeping 2 0 3 2 3   Tired, decreased energy 3 0 2 3 3   Change in appetite 2 2 2 1 3   Feeling bad or failure about yourself  2 3 2 3 3   Trouble concentrating 0 0 0 0 0  Moving slowly or fidgety/restless 0 0 0 0 0  Suicidal thoughts 1 1 1 2  0  PHQ-9 Score 15 11 15 16 18   Difficult doing work/chores Very difficult Somewhat difficult Somewhat difficult Very difficult Very difficult   GAD 7 : Generalized Anxiety Score 08/04/2019 06/23/2019  Nervous, Anxious, on Edge 3 0  Control/stop worrying 0 0  Worry too much - different things 0 0  Trouble relaxing 0 0  Restless 0 0  Easily annoyed or irritable 0 0  Afraid - awful might happen 0 0  Total GAD 7 Score 3 0  Anxiety Difficulty Not difficult at all Not difficult at all     ------------------------------------------------------------------------     Gastroesophageal reflux disease with esophagitis, unspecified whether hemorrhage From 06/23/2019-Refilled aciphex.     Allergies  Allergen Reactions  . Venlafaxine     panic atack/anxiety/"flu like" symptoms-severe     Current Outpatient Medications:  .  ALPRAZolam (XANAX) 0.5 MG tablet, TAKE ONE TABLET EVERY 8 HOURS AS NEEDED FOR ANXIETY., Disp: 90 tablet, Rfl: 2 .  Probiotic Product (PROBIOTIC DAILY PO), Take 1 capsule by mouth daily. , Disp: , Rfl:  .  RABEprazole (ACIPHEX) 20 MG tablet, Take 1 tablet (20 mg total) by mouth daily., Disp: 30 tablet, Rfl: 12 .  tamsulosin (FLOMAX) 0.4 MG CAPS capsule, Take 1 capsule (0.4 mg total) by mouth daily., Disp: 30 capsule, Rfl: 11 .  desvenlafaxine (PRISTIQ) 50 MG 24 hr tablet, Take 50 mg by mouth daily., Disp: , Rfl:   Review of Systems  Constitutional: Positive for fatigue. Negative for appetite change, chills and fever.  Eyes: Negative.   Respiratory: Negative for chest tightness, shortness of breath and wheezing.   Cardiovascular: Negative for chest pain and palpitations.  Gastrointestinal: Negative for abdominal pain, nausea and vomiting.  Endocrine: Negative.   Allergic/Immunologic: Negative.   Hematological: Negative.   Psychiatric/Behavioral: Positive for decreased concentration. The patient is nervous/anxious.     Social History   Tobacco Use  .  Smoking status: Never Smoker  . Smokeless tobacco: Current User    Types: Chew  Substance Use Topics  . Alcohol use: Yes    Alcohol/week: 3.0 standard drinks    Types: 1 Glasses of wine, 2 Cans of beer per week    Comment: WINE AND BEER OCC      Objective:   BP 107/74 (BP Location: Right Arm, Patient Position: Sitting, Cuff Size: Large)   Pulse 76   Temp (!) 97.5 F (36.4 C) (Temporal)   Resp 16   Ht 5\' 8"  (1.727 m)   Wt 200 lb (90.7 kg)   BMI 30.41 kg/m  Vitals:   08/04/19 1516  BP: 107/74  Pulse: 76  Resp: 16  Temp: (!) 97.5 F (36.4 C)  TempSrc: Temporal   Weight: 200 lb (90.7 kg)  Height: 5\' 8"  (1.727 m)  Body mass index is 30.41 kg/m.   Physical Exam Vitals reviewed.  Constitutional:      Appearance: He is well-developed.  HENT:     Head: Normocephalic and atraumatic.     Right Ear: External ear normal.     Left Ear: External ear normal.     Nose: Nose normal.  Eyes:     Conjunctiva/sclera: Conjunctivae normal.     Pupils: Pupils are equal, round, and reactive to light.  Cardiovascular:     Rate and Rhythm: Normal rate and regular rhythm.     Heart sounds: Normal heart sounds.  Pulmonary:     Effort: Pulmonary effort is normal.     Breath sounds: Normal breath sounds.  Abdominal:     General: Bowel sounds are normal.     Palpations: Abdomen is soft.  Musculoskeletal:     Cervical back: Normal range of motion and neck supple.  Skin:    General: Skin is warm and dry.  Neurological:     General: No focal deficit present.     Mental Status: He is alert and oriented to person, place, and time.  Psychiatric:        Mood and Affect: Mood normal.        Behavior: Behavior normal.        Thought Content: Thought content normal.        Judgment: Judgment normal.      No results found for any visits on 08/04/19.     Assessment & Plan    1. Recurrent major depressive disorder, in partial remission [facility]) He is doing no better. After discussion will stop Pristiq and start Zoloft 50mg  but also refer to psychiatry. - sertraline (ZOLOFT) 50 MG tablet; Take 1 tablet (50 mg total) by mouth daily.  Dispense: 30 tablet; Refill: 3 - Ambulatory referral to Psychiatry RTC 1 month. Continue to see 08/06/19 weekly. 2. Anxiety  - Ambulatory referral to Psychiatry - ALPRAZolam (XANAX) 0.5 MG tablet; TAKE ONE TABLET EVERY 8 HOURS AS NEEDED FOR ANXIETY.  Dispense: 90 tablet; Refill: 2     Richard [facility], MD  [facility]

## 2019-08-04 ENCOUNTER — Ambulatory Visit (INDEPENDENT_AMBULATORY_CARE_PROVIDER_SITE_OTHER): Payer: BLUE CROSS/BLUE SHIELD | Admitting: Family Medicine

## 2019-08-04 ENCOUNTER — Other Ambulatory Visit: Payer: Self-pay

## 2019-08-04 ENCOUNTER — Encounter: Payer: Self-pay | Admitting: Family Medicine

## 2019-08-04 VITALS — BP 107/74 | HR 76 | Temp 97.5°F | Resp 16 | Ht 68.0 in | Wt 200.0 lb

## 2019-08-04 DIAGNOSIS — F3341 Major depressive disorder, recurrent, in partial remission: Secondary | ICD-10-CM | POA: Diagnosis not present

## 2019-08-04 DIAGNOSIS — F419 Anxiety disorder, unspecified: Secondary | ICD-10-CM

## 2019-08-04 MED ORDER — SERTRALINE HCL 50 MG PO TABS: 50.0000 mg | ORAL_TABLET | Freq: Every day | ORAL | 3 refills | Status: DC

## 2019-08-06 MED ORDER — ALPRAZOLAM 0.5 MG PO TABS: ORAL_TABLET | ORAL | 2 refills | Status: DC

## 2019-08-11 ENCOUNTER — Telehealth: Payer: Self-pay | Admitting: Family Medicine

## 2019-08-11 ENCOUNTER — Telehealth: Payer: Self-pay

## 2019-08-11 NOTE — Telephone Encounter (Signed)
Per Dr. Sullivan Lone, advised patient to stop sertraline for now and schedule ov for 08/13/2019. Patient expressed understanding.

## 2019-08-11 NOTE — Telephone Encounter (Signed)
Called patient because he was having side effects from Sertraline 50mg , he said he was having some diarrhea and extreme panic like anxiety. Advised patient to discontinue medication, please advise?

## 2019-08-11 NOTE — Telephone Encounter (Signed)
Copied from CRM [id number]. Topic: General - Other >> Aug 11, 2019  4:20 PM Tamela Oddi wrote: Reason for CRM: Patient called to inform the doctor that he has had some bad side effects from sertraline (ZOLOFT) 50 MG tablet. Also patient has not heard from the referral that was placed.   Please advise and call patient to discuss at [phone]

## 2019-08-12 NOTE — Progress Notes (Signed)
Patient: Ronald M Martinique Jr. Male    DOB: May 17, 1961   59 y.o.   MRN: [id number] Visit Date: 08/13/2019  Today's Provider: Wilhemena Durie, MD   Chief Complaint  Patient presents with  . Depression  . Anxiety   Subjective:     HPI   Recurrent major depressive disorder, in partial remission ([place]) From 08/04/2019-stopped Pristiq and started Zoloft 50 mg. Referred back to Psychiatry. Follow up in 1 month. Continue to see Eugenia Pancoast weekly.  He just left from an appointment with Eugenia Pancoast.  He is feeling a little bit better.  He states that Ovid Curd thinks that he might have had serotonin syndrome.  From the Zoloft 50 mg daily.  No other serotonin drugs.  He was anxious and panicky and had diarrhea and chills.  All this is better.  He continues to be somewhat depressed and a little anxious.  Other symptoms have resolved.  He has not heard back from Dr. Clotilde Dieter, reach out to other psychiatrist try to get him in a little sooner.  He is not suicidal or homicidal.  He looks forward to see and interacting with all 3 of his children.  Patient called 08/11/2019 stating he is having bad side effects to sertraline and had discontinued medication. Patient reported symptoms of extreme panic/anxiety and diarrhea on Zoloft. Patient reports that he had visit today with Eugenia Pancoast.  Anxiety From 08/04/2019-refilled rx for alprazolam 0.5 MG. Referred back to Psychiatry.  Patient reports good compliance and tolerance on Alprazolam   Allergies  Allergen Reactions  . Venlafaxine     panic atack/anxiety/"flu like" symptoms-severe  . Sertraline Diarrhea and Anxiety     Current Outpatient Medications:  .  ALPRAZolam (XANAX) 0.5 MG tablet, TAKE ONE TABLET EVERY 8 HOURS AS NEEDED FOR ANXIETY., Disp: 90 tablet, Rfl: 2 .  desvenlafaxine (PRISTIQ) 50 MG 24 hr tablet, Take 50 mg by mouth daily., Disp: , Rfl:  .  Probiotic Product (PROBIOTIC DAILY PO), Take 1 capsule by mouth daily. ,  Disp: , Rfl:  .  RABEprazole (ACIPHEX) 20 MG tablet, Take 1 tablet (20 mg total) by mouth daily., Disp: 30 tablet, Rfl: 12 .  sertraline (ZOLOFT) 50 MG tablet, Take 1 tablet (50 mg total) by mouth daily., Disp: 30 tablet, Rfl: 3 .  tamsulosin (FLOMAX) 0.4 MG CAPS capsule, Take 1 capsule (0.4 mg total) by mouth daily., Disp: 30 capsule, Rfl: 11  Review of Systems  Constitutional: Negative for appetite change, chills and fever.  Eyes: Negative.   Respiratory: Negative for chest tightness, shortness of breath and wheezing.   Cardiovascular: Negative for chest pain and palpitations.  Gastrointestinal: Positive for diarrhea. Negative for abdominal pain, nausea and vomiting.  Endocrine: Negative.   Musculoskeletal: Positive for back pain.       Chronic, stable back pain  Allergic/Immunologic: Negative.   Hematological: Negative.   Psychiatric/Behavioral: Positive for agitation. Negative for behavioral problems, confusion, decreased concentration, dysphoric mood, hallucinations, self-injury, sleep disturbance and suicidal ideas. The patient is nervous/anxious. The patient is not hyperactive.     Social History   Tobacco Use  . Smoking status: Never Smoker  . Smokeless tobacco: Current User    Types: Chew  Substance Use Topics  . Alcohol use: Yes    Alcohol/week: 3.0 standard drinks    Types: 1 Glasses of wine, 2 Cans of beer per week    Comment: [employer]  Objective:   BP 124/78   Pulse 66   Temp (!) 97 F (36.1 C) (Oral)   Resp 16   Wt 197 lb 3.2 oz (89.4 kg)   SpO2 96%   BMI 29.98 kg/m  Vitals:   08/13/19 1518  BP: 124/78  Pulse: 66  Resp: 16  Temp: (!) 97 F (36.1 C)  TempSrc: Oral  SpO2: 96%  Weight: 197 lb 3.2 oz (89.4 kg)  Body mass index is 29.98 kg/m.   Physical Exam Vitals reviewed.  Constitutional:      Appearance: Normal appearance.  HENT:     Head: Normocephalic and atraumatic.     Right Ear: External ear normal.     Left Ear: External  ear normal.  Eyes:     General: No scleral icterus.    Conjunctiva/sclera: Conjunctivae normal.  Cardiovascular:     Rate and Rhythm: Normal rate and regular rhythm.     Heart sounds: Normal heart sounds.  Pulmonary:     Effort: Pulmonary effort is normal.     Breath sounds: Normal breath sounds.  Musculoskeletal:     Right lower leg: No edema.     Left lower leg: No edema.  Skin:    General: Skin is warm and dry.  Neurological:     General: No focal deficit present.     Mental Status: He is alert and oriented to person, place, and time.  Psychiatric:        Mood and Affect: Mood normal.        Behavior: Behavior normal.        Thought Content: Thought content normal.        Judgment: Judgment normal.      No results found for any visits on 08/13/19.     Assessment & Plan    1. Major depressive disorder with current active episode, unspecified depression episode severity, unspecified whether recurrent After discussion with patient will try sertraline 50 mg 1/2 tablet every other day for about 10 days then go to full tablet every other day then full tablet.  Waiting for a call for psychiatry appointment.  2. Serotonin syndrome Go very slowly with serotonin drug.  I am not sure he had a true serotonin surge syndrome but will be cautious.     Richard Wendelyn Breslow, MD  [facility]

## 2019-08-13 ENCOUNTER — Other Ambulatory Visit: Payer: Self-pay

## 2019-08-13 ENCOUNTER — Encounter: Payer: Self-pay | Admitting: Family Medicine

## 2019-08-13 ENCOUNTER — Ambulatory Visit (INDEPENDENT_AMBULATORY_CARE_PROVIDER_SITE_OTHER): Payer: BLUE CROSS/BLUE SHIELD | Admitting: Family Medicine

## 2019-08-13 VITALS — BP 124/78 | HR 66 | Temp 97.0°F | Resp 16 | Wt 197.2 lb

## 2019-08-13 DIAGNOSIS — F329 Major depressive disorder, single episode, unspecified: Secondary | ICD-10-CM | POA: Diagnosis not present

## 2019-08-13 DIAGNOSIS — G9081 Serotonin syndrome: Secondary | ICD-10-CM

## 2019-08-13 DIAGNOSIS — G2579 Other drug induced movement disorders: Secondary | ICD-10-CM

## 2019-08-13 NOTE — Patient Instructions (Signed)
Cutt Sertraline to 1/2 tablet every other day for one week then, increase to a whole tablet.

## 2019-09-02 ENCOUNTER — Ambulatory Visit: Payer: Self-pay | Admitting: Family Medicine

## 2020-05-16 ENCOUNTER — Other Ambulatory Visit: Payer: Self-pay | Admitting: Family Medicine

## 2020-05-16 DIAGNOSIS — N400 Enlarged prostate without lower urinary tract symptoms: Secondary | ICD-10-CM

## 2020-06-20 ENCOUNTER — Other Ambulatory Visit: Payer: Self-pay | Admitting: Family Medicine

## 2020-06-20 DIAGNOSIS — N400 Enlarged prostate without lower urinary tract symptoms: Secondary | ICD-10-CM

## 2020-09-21 ENCOUNTER — Other Ambulatory Visit: Payer: Self-pay | Admitting: Family Medicine

## 2020-09-21 DIAGNOSIS — K2100 Gastro-esophageal reflux disease with esophagitis, without bleeding: Secondary | ICD-10-CM

## 2020-09-21 NOTE — Telephone Encounter (Signed)
Requested medication (s) are due for refill today: yes  Requested medication (s) are on the active medication list: yes  Last refill:  08/22/2020  Future visit scheduled: no  Notes to clinic: courtesy refill already given  Vm left for patient   Requested Prescriptions  Pending Prescriptions Disp Refills   RABEprazole (ACIPHEX) 20 MG tablet [Pharmacy Med Name: RABEPRAZOLE SOD DR 20 MG TAB] 30 tablet 0    Sig: Take 1 tablet (20 mg total) by mouth daily.      Gastroenterology: Proton Pump Inhibitors Failed - 09/21/2020 11:09 AM      Failed - Valid encounter within last 12 months    Recent Outpatient Visits           1 year ago Major depressive disorder with current active episode, unspecified depression episode severity, unspecified whether recurrent   [facility] Maple Hudson., MD   1 year ago Recurrent major depressive disorder, in partial remission [facility])   [facility] Maple Hudson., MD   1 year ago Anxiety   [facility] Maple Hudson., MD   1 year ago Annual physical exam   [facility] Maple Hudson., MD   1 year ago Moderate episode of recurrent major depressive disorder [facility])   [facility] Maple Hudson., MD

## 2020-10-24 ENCOUNTER — Other Ambulatory Visit: Payer: Self-pay | Admitting: Family Medicine

## 2020-10-24 DIAGNOSIS — K2100 Gastro-esophageal reflux disease with esophagitis, without bleeding: Secondary | ICD-10-CM

## 2020-10-24 NOTE — Telephone Encounter (Signed)
Requested medication (s) are due for refill today: yes  Requested medication (s) are on the active medication list: yes  Last refill: 09/21/2020  Future visit scheduled: no  Notes to clinic: Overdue for follow up appointment    Requested Prescriptions  Pending Prescriptions Disp Refills   RABEprazole (ACIPHEX) 20 MG tablet [Pharmacy Med Name: RABEPRAZOLE SOD DR 20 MG TAB] 30 tablet 0    Sig: Take 1 tablet (20 mg total) by mouth daily.      Gastroenterology: Proton Pump Inhibitors Failed - 10/24/2020  9:36 AM      Failed - Valid encounter within last 12 months    Recent Outpatient Visits           1 year ago Major depressive disorder with current active episode, unspecified depression episode severity, unspecified whether recurrent   [facility] Maple Hudson., MD   1 year ago Recurrent major depressive disorder, in partial remission [facility])   [facility] Maple Hudson., MD   1 year ago Anxiety   [facility] Maple Hudson., MD   1 year ago Annual physical exam   [facility] Maple Hudson., MD   1 year ago Moderate episode of recurrent major depressive disorder [facility])   [facility] Maple Hudson., MD

## 2020-10-26 ENCOUNTER — Telehealth: Payer: Self-pay

## 2020-10-26 DIAGNOSIS — K2100 Gastro-esophageal reflux disease with esophagitis, without bleeding: Secondary | ICD-10-CM

## 2020-10-26 MED ORDER — RABEPRAZOLE SODIUM 20 MG PO TBEC: 20.0000 mg | DELAYED_RELEASE_TABLET | Freq: Every day | ORAL | 0 refills | Status: DC

## 2020-10-26 NOTE — Telephone Encounter (Signed)
Looks like refill was sent in.

## 2020-10-26 NOTE — Telephone Encounter (Signed)
Since patient does have a scheduled appointment scheduled for 11/08/20 is it okay to refill medication?

## 2020-10-26 NOTE — Telephone Encounter (Signed)
Copied from CRM [id number]. Topic: General - Inquiry >> Oct 25, 2020  5:37 PM Ronald Welch wrote:  Pt has made an office visit for first available. 5/24. with Dr Reece Agar  per request.  He would like if possible to have enough only to get him to the appt?                                                              RABEprazole (ACIPHEX) 20 MG tablet 30 tablet 0 09/21/2020   Sig - Route: Take 1 tablet (20 mg total) by mouth daily. - Oral  Sent to pharmacy as: RABEprazole (ACIPHEX) 20 MG tablet  Notes to Pharmacy: Please tell patient he needs to have office visit [facility] - [place], [place] - [address] [place] [address] Phone: [phone] Fax: [phone]

## 2020-10-26 NOTE — Telephone Encounter (Signed)
yes

## 2020-11-08 ENCOUNTER — Ambulatory Visit: Payer: BLUE CROSS/BLUE SHIELD | Admitting: Family Medicine

## 2020-11-24 ENCOUNTER — Other Ambulatory Visit: Payer: Self-pay | Admitting: Family Medicine

## 2020-11-24 DIAGNOSIS — K2100 Gastro-esophageal reflux disease with esophagitis, without bleeding: Secondary | ICD-10-CM

## 2020-11-25 NOTE — Progress Notes (Signed)
Established patient visit   Patient: Ronald M Swaziland Jr.   DOB: [birth year]   59 y.o. Male  MRN: [id number] Visit Date: 11/28/2020  Today's healthcare provider: Megan Mans, MD   Chief Complaint  Patient presents with   Depression   Subjective    HPI  Patient is doing much better.  He is working on diet and exercise and has lost 30 pounds since his last visit.  He is no longer on sertraline but is taking Lexapro daily. He is divorced and the father of 3 children.  He is doing well with his children. He states he is having some problems with ED and wishes prescription.  He has no cardiovascular risk factors and no cardiovascular symptoms. Depression, Follow-up  He  was last seen for this 1 years ago. Changes made at last visit include try sertraline 50 mg 1/2 tablet every other day for about 10 days then go to full tablet every other day then full tablet.   He reports fair compliance with treatment. He is not having side effects.   He reports good tolerance of treatment. Current symptoms include:  none He feels he is Improved since last visit.  Depression screen [facility] 2/9 11/28/2020 08/04/2019 06/23/2019  Decreased Interest 0 3 3  Down, Depressed, Hopeless 0 2 2  PHQ - 2 Score 0 5 5  Altered sleeping 0 2 0  Tired, decreased energy 0 3 0  Change in appetite 0 2 2  Feeling bad or failure about yourself  0 2 3  Trouble concentrating 0 0 0  Moving slowly or fidgety/restless 0 0 0  Suicidal thoughts 0 1 1  PHQ-9 Score 0 15 11  Difficult doing work/chores Not difficult at all Very difficult Somewhat difficult        Medications: Outpatient Medications Prior to Visit  Medication Sig   escitalopram (LEXAPRO) 20 MG tablet Take 1 tablet by mouth daily.   gabapentin (NEURONTIN) 100 MG capsule Take 3 capsules by mouth daily.   Probiotic Product (PROBIOTIC DAILY PO) Take 1 capsule by mouth daily.    RABEprazole (ACIPHEX) 20 MG tablet Take 1 tablet (20 mg total) by mouth  daily.   tamsulosin (FLOMAX) 0.4 MG CAPS capsule Take 1 capsule (0.4 mg total) by mouth daily.   ALPRAZolam (XANAX) 0.5 MG tablet TAKE ONE TABLET EVERY 8 HOURS AS NEEDED FOR ANXIETY. (Patient not taking: Reported on 11/28/2020)   desvenlafaxine (PRISTIQ) 50 MG 24 hr tablet Take 50 mg by mouth daily. (Patient not taking: Reported on 11/28/2020)   sertraline (ZOLOFT) 50 MG tablet Take 1 tablet (50 mg total) by mouth daily. (Patient not taking: Reported on 11/28/2020)   No facility-administered medications prior to visit.    Review of Systems  Constitutional:  Negative for activity change and fatigue.  Respiratory:  Negative for cough and shortness of breath.   Cardiovascular:  Negative for chest pain, palpitations and leg swelling.  Neurological:  Negative for dizziness, light-headedness and headaches.  Psychiatric/Behavioral:  Negative for agitation, self-injury, sleep disturbance and suicidal ideas. The patient is not nervous/anxious and is not hyperactive.        Objective    BP 121/78   Pulse 77   Temp 98.4 F (36.9 C)   Resp 16   Ht 5' 7.5" (1.715 m)   Wt 168 lb (76.2 kg)   BMI 25.92 kg/m  BP Readings from Last 3 Encounters:  11/28/20 121/78  08/13/19 124/78  08/04/19 107/74  Wt Readings from Last 3 Encounters:  11/28/20 168 lb (76.2 kg)  08/13/19 197 lb 3.2 oz (89.4 kg)  08/04/19 200 lb (90.7 kg)       Physical Exam Vitals reviewed.  Constitutional:      Appearance: Normal appearance.  HENT:     Head: Normocephalic and atraumatic.     Right Ear: External ear normal.     Left Ear: External ear normal.  Eyes:     General: No scleral icterus.    Conjunctiva/sclera: Conjunctivae normal.  Cardiovascular:     Rate and Rhythm: Normal rate and regular rhythm.     Heart sounds: Normal heart sounds.  Pulmonary:     Effort: Pulmonary effort is normal.     Breath sounds: Normal breath sounds.  Musculoskeletal:     Right lower leg: No edema.     Left lower leg: No  edema.  Skin:    General: Skin is warm and dry.  Neurological:     General: No focal deficit present.     Mental Status: He is alert and oriented to person, place, and time.  Psychiatric:        Mood and Affect: Mood normal.        Behavior: Behavior normal.        Thought Content: Thought content normal.        Judgment: Judgment normal.      No results found for any visits on 11/28/20.  Assessment & Plan     1. Major depressive disorder with current active episode, unspecified depression episode severity, unspecified whether recurrent Current controlled on Lexapro  2. Erectile dysfunction, unspecified erectile dysfunction type Try Cialis 20 mg every 3 days as needed. - tadalafil (CIALIS) 20 MG tablet; Take 1 tablet (20 mg total) by mouth every 3 (three) days as needed for erectile dysfunction.  Dispense: 25 tablet; Refill: 4  3. Anxiety Much improved.  Follow-up in the next year for physical   No follow-ups on file.      I, Megan Mans, MD, have reviewed all documentation for this visit. The documentation on 12/02/20 for the exam, diagnosis, procedures, and orders are all accurate and complete.    Janaisa Birkland Wendelyn Breslow, MD  [facility] [phone] (phone) [phone] (fax)  [facility]

## 2020-11-28 ENCOUNTER — Encounter: Payer: Self-pay | Admitting: Family Medicine

## 2020-11-28 ENCOUNTER — Other Ambulatory Visit: Payer: Self-pay

## 2020-11-28 ENCOUNTER — Ambulatory Visit (INDEPENDENT_AMBULATORY_CARE_PROVIDER_SITE_OTHER): Payer: Self-pay | Admitting: Family Medicine

## 2020-11-28 VITALS — BP 121/78 | HR 77 | Temp 98.4°F | Resp 16 | Ht 67.5 in | Wt 168.0 lb

## 2020-11-28 DIAGNOSIS — F329 Major depressive disorder, single episode, unspecified: Secondary | ICD-10-CM | POA: Diagnosis not present

## 2020-11-28 DIAGNOSIS — N529 Male erectile dysfunction, unspecified: Secondary | ICD-10-CM

## 2020-11-28 DIAGNOSIS — F419 Anxiety disorder, unspecified: Secondary | ICD-10-CM | POA: Diagnosis not present

## 2020-11-28 MED ORDER — TADALAFIL 20 MG PO TABS: 20.0000 mg | ORAL_TABLET | ORAL | 4 refills | Status: AC | PRN

## 2021-01-06 ENCOUNTER — Other Ambulatory Visit: Payer: Self-pay | Admitting: Family Medicine

## 2021-01-06 DIAGNOSIS — K2100 Gastro-esophageal reflux disease with esophagitis, without bleeding: Secondary | ICD-10-CM

## 2021-02-06 ENCOUNTER — Other Ambulatory Visit: Payer: Self-pay | Admitting: Family Medicine

## 2021-02-06 DIAGNOSIS — K2100 Gastro-esophageal reflux disease with esophagitis, without bleeding: Secondary | ICD-10-CM

## 2021-05-30 ENCOUNTER — Ambulatory Visit: Payer: Self-pay | Admitting: Family Medicine

## 2021-06-06 ENCOUNTER — Ambulatory Visit: Payer: BLUE CROSS/BLUE SHIELD | Admitting: Family Medicine

## 2021-06-20 ENCOUNTER — Other Ambulatory Visit: Payer: Self-pay

## 2021-06-20 ENCOUNTER — Ambulatory Visit (INDEPENDENT_AMBULATORY_CARE_PROVIDER_SITE_OTHER): Payer: BLUE CROSS/BLUE SHIELD | Admitting: Family Medicine

## 2021-06-20 ENCOUNTER — Encounter: Payer: Self-pay | Admitting: Family Medicine

## 2021-06-20 VITALS — BP 128/81 | HR 73 | Temp 98.5°F | Resp 16 | Ht 68.0 in | Wt 167.0 lb

## 2021-06-20 DIAGNOSIS — N401 Enlarged prostate with lower urinary tract symptoms: Secondary | ICD-10-CM

## 2021-06-20 DIAGNOSIS — R350 Frequency of micturition: Secondary | ICD-10-CM

## 2021-06-20 DIAGNOSIS — F3341 Major depressive disorder, recurrent, in partial remission: Secondary | ICD-10-CM | POA: Diagnosis not present

## 2021-06-20 DIAGNOSIS — F419 Anxiety disorder, unspecified: Secondary | ICD-10-CM | POA: Diagnosis not present

## 2021-06-20 NOTE — Assessment & Plan Note (Signed)
Manageable on current regimen with current stressor. Continue current regimen and following with psych.

## 2021-06-20 NOTE — Progress Notes (Signed)
° °  SUBJECTIVE:   CHIEF COMPLAINT / HPI:   Anxiety/Depression - Medications: lexapro, gabapentin - Taking: good compliance - Counseling: yes, seeing Psychiatrist - Symptoms: nervousness, anxiousness - Current stressors: son - Coping Mechanisms: gabapentin  GAD 7 : Generalized Anxiety Score 06/20/2021 08/04/2019 06/23/2019  Nervous, Anxious, on Edge 3 3 0  Control/stop worrying 0 0 0  Worry too much - different things 0 0 0  Trouble relaxing 1 0 0  Restless 1 0 0  Easily annoyed or irritable 0 0 0  Afraid - awful might happen 0 0 0  Total GAD 7 Score 5 3 0  Anxiety Difficulty Not difficult at all Not difficult at all Not difficult at all   Depression screen [facility] 2/9 06/20/2021 11/28/2020 08/04/2019  Decreased Interest 0 0 3  Down, Depressed, Hopeless 0 0 2  PHQ - 2 Score 0 0 5  Altered sleeping 0 0 2  Tired, decreased energy 1 0 3  Change in appetite 1 0 2  Feeling bad or failure about yourself  0 0 2  Trouble concentrating 0 0 0  Moving slowly or fidgety/restless 0 0 0  Suicidal thoughts 0 0 1  PHQ-9 Score 2 0 15  Difficult doing work/chores Not difficult at all Not difficult at all Very difficult  Some recent data might be hidden    Benign Prostatic Hypertrophy - Medications: flomax - Symptoms: urgency  - Denies: gross hematuria - a family history of prostate cancer, dad in 51s and uncle in 34s.   OBJECTIVE:   BP 128/81 (BP Location: Left Arm, Patient Position: Sitting, Cuff Size: Large)    Pulse 73    Temp 98.5 F (36.9 C) (Temporal)    Resp 16    Ht 5\' 8"  (1.727 m)    Wt 167 lb (75.8 kg)    SpO2 98%    BMI 25.39 kg/m   Gen: well appearing, in NAD Card: Reg rate Lungs: Comfortable WOB on RA Ext: WWP   ASSESSMENT/PLAN:   BPH (benign prostatic hyperplasia) Doing well on flomax though does report worsened urgency. Due for prostate exam and PSA given +family history. Will plan for full exam and labs at upcoming annual physical. Continue flomax.   Anxiety Manageable  on current regimen with current stressor. Continue current regimen and following with psych.  Clinical depression Improved. Doing well on current regimen, no changes made today.     Myles Gip, DO

## 2021-06-20 NOTE — Assessment & Plan Note (Signed)
Doing well on flomax though does report worsened urgency. Due for prostate exam and PSA given +family history. Will plan for full exam and labs at upcoming annual physical. Continue flomax.

## 2021-06-20 NOTE — Assessment & Plan Note (Signed)
Improved. Doing well on current regimen, no changes made today.

## 2021-08-11 ENCOUNTER — Other Ambulatory Visit: Payer: Self-pay | Admitting: Family Medicine

## 2021-08-11 DIAGNOSIS — N400 Enlarged prostate without lower urinary tract symptoms: Secondary | ICD-10-CM

## 2021-08-11 NOTE — Telephone Encounter (Signed)
Requested medication (s) are due for refill today:   Yes ° °Requested medication (s) are on the active medication list:   Yes ° °Future visit scheduled:   Yes ° ° °Last ordered: 06/20/2020 #30, 11 refills ° °Returned because protocol criteria not met.   PSA due  ° °Requested Prescriptions  °Pending Prescriptions Disp Refills  ° tamsulosin (FLOMAX) 0.4 MG CAPS capsule [Pharmacy Med Name: TAMSULOSIN HCL 0.4 MG CAPSULE] 30 capsule 0  °  Sig: Take 1 capsule (0.4 mg total) by mouth daily.  °  ° Urology: Alpha-Adrenergic Blocker Failed - 08/11/2021 12:02 PM  °  °  Failed - PSA in normal range and within 360 days  °  PSA  °Date Value Ref Range Status  °03/13/2017 1.8 < OR = 4.0 ng/mL Final  °  Comment:  °  The total PSA value from this assay system is  °standardized against the WHO standard. The test  °result will be approximately 20% lower when compared  °to the equimolar-standardized total PSA (Beckman  °Coulter). Comparison of serial PSA results should be  °interpreted with this fact in mind. °. °This test was performed using the Siemens  °chemiluminescent method. Values obtained from  °different assay methods cannot be used °interchangeably. PSA levels, regardless of °value, should not be interpreted as absolute °evidence of the presence or absence of disease. °  °10/13/2013 1.5  Final  ° °Prostate Specific Ag, Serum  °Date Value Ref Range Status  °04/23/2019 3.0 0.0 - 4.0 ng/mL Final  °  Comment:  °  Roche ECLIA methodology. °According to the American Urological Association, Serum PSA should °decrease and remain at undetectable levels after radical °prostatectomy. The AUA defines biochemical recurrence as an initial °PSA value 0.2 ng/mL or greater followed by a subsequent confirmatory °PSA value 0.2 ng/mL or greater. °Values obtained with different assay methods or kits cannot be used °interchangeably. Results cannot be interpreted as absolute evidence °of the presence or absence of malignant disease. °  °  °  °  °   Passed - Last BP in normal range  °  BP Readings from Last 1 Encounters:  °06/20/21 128/81  °  °  °  °  Passed - Valid encounter within last 12 months  °  Recent Outpatient Visits   ° °      ° 1 month ago Benign prostatic hyperplasia with urinary frequency  ° [facility] Rumball, Alison M, DO  ° 8 months ago Major depressive disorder with current active episode, unspecified depression episode severity, unspecified whether recurrent  ° [facility] Gilbert, Richard L Jr., MD  ° 1 year ago Major depressive disorder with current active episode, unspecified depression episode severity, unspecified whether recurrent  ° [facility] Gilbert, Richard L Jr., MD  ° 2 years ago Recurrent major depressive disorder, in partial remission (HCC)  ° [facility] Gilbert, Richard L Jr., MD  ° 2 years ago Anxiety  ° [facility] Gilbert, Richard L Jr., MD  ° °  °  °Future Appointments   ° °        ° In 2 months Gilbert, Richard L Jr., MD [facility]  ° °  ° °  °  °  ° °

## 2021-09-16 ENCOUNTER — Other Ambulatory Visit: Payer: Self-pay | Admitting: Family Medicine

## 2021-09-16 DIAGNOSIS — N400 Enlarged prostate without lower urinary tract symptoms: Secondary | ICD-10-CM

## 2021-09-18 NOTE — Telephone Encounter (Signed)
Requested medication (s) are due for refill today: yes ? ?Requested medication (s) are on the active medication list: yes ? ?Last refill:  08/14/21 ? ?Future visit scheduled: no ? ?Notes to clinic:  Unable to refill per protocol due to failed labs. ? ? ?  ?Requested Prescriptions  ?Pending Prescriptions Disp Refills  ? tamsulosin (FLOMAX) 0.4 MG CAPS capsule [Pharmacy Med Name: TAMSULOSIN HCL 0.4 MG CAPSULE] 30 capsule 0  ?  Sig: Take 1 capsule (0.4 mg total) by mouth daily.  ?  ? Urology: Alpha-Adrenergic Blocker Failed - 09/16/2021 12:30 PM  ?  ?  Failed - PSA in normal range and within 360 days  ?  PSA  ?Date Value Ref Range Status  ?03/13/2017 1.8 < OR = 4.0 ng/mL Final  ?  Comment:  ?  The total PSA value from this assay system is  ?standardized against the WHO standard. The test  ?result will be approximately 20% lower when compared  ?to the equimolar-standardized total PSA (Beckman  ?Coulter). Comparison of serial PSA results should be  ?interpreted with this fact in mind. ?. ?This test was performed using the Siemens  ?chemiluminescent method. Values obtained from  ?different assay methods cannot be used ?interchangeably. PSA levels, regardless of ?value, should not be interpreted as absolute ?evidence of the presence or absence of disease. ?  ?10/13/2013 1.5  Final  ? ?Prostate Specific Ag, Serum  ?Date Value Ref Range Status  ?04/23/2019 3.0 0.0 - 4.0 ng/mL Final  ?  Comment:  ?  Roche ECLIA methodology. ?According to the American Urological Association, Serum PSA should ?decrease and remain at undetectable levels after radical ?prostatectomy. The AUA defines biochemical recurrence as an initial ?PSA value 0.2 ng/mL or greater followed by a subsequent confirmatory ?PSA value 0.2 ng/mL or greater. ?Values obtained with different assay methods or kits cannot be used ?interchangeably. Results cannot be interpreted as absolute evidence ?of the presence or absence of malignant disease. ?  ?  ?  ?  ?  Passed - Last  BP in normal range  ?  BP Readings from Last 1 Encounters:  ?06/20/21 128/81  ?  ?  ?  ?  Passed - Valid encounter within last 12 months  ?  Recent Outpatient Visits   ? ?      ? 3 months ago Benign prostatic hyperplasia with urinary frequency  ? [facility] Dense M, DO  ? 9 months ago Major depressive disorder with current active episode, unspecified depression episode severity, unspecified whether recurrent  ? [facility] Maple Hudson., MD  ? 2 years ago Major depressive disorder with current active episode, unspecified depression episode severity, unspecified whether recurrent  ? [facility] Maple Hudson., MD  ? 2 years ago Recurrent major depressive disorder, in partial remission (HCC)  ? [facility] Maple Hudson., MD  ? 2 years ago Anxiety  ? [facility] Maple Hudson., MD  ? ?  ?  ?Future Appointments   ? ?        ? In 1 month Maple Hudson., MD [facility]  ? ?  ? ?  ?  ?  ? ? ?

## 2021-10-18 ENCOUNTER — Other Ambulatory Visit: Payer: Self-pay | Admitting: Family Medicine

## 2021-10-18 DIAGNOSIS — N400 Enlarged prostate without lower urinary tract symptoms: Secondary | ICD-10-CM

## 2021-10-19 NOTE — Telephone Encounter (Signed)
Pt has future OV 10/30/21, will refill medication for 30 days. ? ?Requested Prescriptions  ?Pending Prescriptions Disp Refills  ?? tamsulosin (FLOMAX) 0.4 MG CAPS capsule [Pharmacy Med Name: TAMSULOSIN HCL 0.4 MG CAPSULE] 30 capsule 0  ?  Sig: Take 1 capsule (0.4 mg total) by mouth daily.  ?  ? Urology: Alpha-Adrenergic Blocker Failed - 10/18/2021  2:24 PM  ?  ?  Failed - PSA in normal range and within 360 days  ?  PSA  ?Date Value Ref Range Status  ?03/13/2017 1.8 < OR = 4.0 ng/mL Final  ?  Comment:  ?  The total PSA value from this assay system is  ?standardized against the WHO standard. The test  ?result will be approximately 20% lower when compared  ?to the equimolar-standardized total PSA (Beckman  ?Coulter). Comparison of serial PSA results should be  ?interpreted with this fact in mind. ?. ?This test was performed using the Siemens  ?chemiluminescent method. Values obtained from  ?different assay methods cannot be used ?interchangeably. PSA levels, regardless of ?value, should not be interpreted as absolute ?evidence of the presence or absence of disease. ?  ?10/13/2013 1.5  Final  ? ?Prostate Specific Ag, Serum  ?Date Value Ref Range Status  ?04/23/2019 3.0 0.0 - 4.0 ng/mL Final  ?  Comment:  ?  Roche ECLIA methodology. ?According to the American Urological Association, Serum PSA should ?decrease and remain at undetectable levels after radical ?prostatectomy. The AUA defines biochemical recurrence as an initial ?PSA value 0.2 ng/mL or greater followed by a subsequent confirmatory ?PSA value 0.2 ng/mL or greater. ?Values obtained with different assay methods or kits cannot be used ?interchangeably. Results cannot be interpreted as absolute evidence ?of the presence or absence of malignant disease. ?  ?   ?  ?  Passed - Last BP in normal range  ?  BP Readings from Last 1 Encounters:  ?06/20/21 128/81  ?   ?  ?  Passed - Valid encounter within last 12 months  ?  Recent Outpatient Visits   ?      ? 4 months ago  Benign prostatic hyperplasia with urinary frequency  ? [place], [place]  ? 10 months ago Major depressive disorder with current active episode, unspecified depression episode severity, unspecified whether recurrent  ? [facility] Jerrol Banana., MD  ? 2 years ago Major depressive disorder with current active episode, unspecified depression episode severity, unspecified whether recurrent  ? [facility] Jerrol Banana., MD  ? 2 years ago Recurrent major depressive disorder, in partial remission ([place])  ? [facility] Jerrol Banana., MD  ? 2 years ago Anxiety  ? [facility] Jerrol Banana., MD  ?  ?  ?Future Appointments   ?        ? In 1 week Jerrol Banana., MD [facility]  ?  ? ?  ?  ?  ? ? ?

## 2021-10-30 ENCOUNTER — Ambulatory Visit (INDEPENDENT_AMBULATORY_CARE_PROVIDER_SITE_OTHER): Payer: BLUE CROSS/BLUE SHIELD | Admitting: Family Medicine

## 2021-10-30 VITALS — BP 136/81 | HR 69 | Temp 98.3°F | Ht 68.0 in | Wt 169.0 lb

## 2021-10-30 DIAGNOSIS — N401 Enlarged prostate with lower urinary tract symptoms: Secondary | ICD-10-CM | POA: Diagnosis not present

## 2021-10-30 DIAGNOSIS — F3341 Major depressive disorder, recurrent, in partial remission: Secondary | ICD-10-CM

## 2021-10-30 DIAGNOSIS — Z125 Encounter for screening for malignant neoplasm of prostate: Secondary | ICD-10-CM | POA: Diagnosis not present

## 2021-10-30 DIAGNOSIS — F419 Anxiety disorder, unspecified: Secondary | ICD-10-CM

## 2021-10-30 DIAGNOSIS — R350 Frequency of micturition: Secondary | ICD-10-CM

## 2021-10-30 DIAGNOSIS — K219 Gastro-esophageal reflux disease without esophagitis: Secondary | ICD-10-CM | POA: Diagnosis not present

## 2021-10-30 DIAGNOSIS — K4090 Unilateral inguinal hernia, without obstruction or gangrene, not specified as recurrent: Secondary | ICD-10-CM

## 2021-10-30 DIAGNOSIS — Z0000 Encounter for general adult medical examination without abnormal findings: Secondary | ICD-10-CM | POA: Diagnosis not present

## 2021-10-30 DIAGNOSIS — F909 Attention-deficit hyperactivity disorder, unspecified type: Secondary | ICD-10-CM

## 2021-10-30 NOTE — Patient Instructions (Signed)
Dr Sullivan Lone would like you to try taking the Aciphex (Rabeprazole) every other day ?

## 2021-10-30 NOTE — Progress Notes (Signed)
Vivien Rota DeSanto,acting as a scribe for Megan Mans, MD.,have documented all relevant documentation on the behalf of Megan Mans, MD,as directed by  Megan Mans, MD while in the presence of Megan Mans, MD.    Complete physical exam   Patient: Ronald M Swaziland Jr.   DOB: [birth year]   61 y.o. Male  MRN: [id number] Visit Date: 10/30/2021  Today's healthcare provider: Megan Mans, MD   No chief complaint on file.  Subjective    Ronald M Swaziland Jr. is a 61 y.o. male who presents today for a complete physical exam.  He reports consuming a general diet. Gym/ health club routine includes cardio and strength training. He generally feels well. He reports sleeping well. He does not have additional problems to discuss today.  HPI    Past Medical History:  Diagnosis Date   Complication of anesthesia    Depressive disorder    ED (erectile dysfunction)    Enthesopathy    GERD (gastroesophageal reflux disease)    H/O measles    History of dysuria    History of hiatal hernia    SMALL    Hypercholesterolemia    Meralgia paraesthetica    Numbness of anterior thigh    Pharyngitis, acute    PONV (postoperative nausea and vomiting)    AFTER HERNIA SURGERY   Sinusitis    Tension headache    Vitamin D deficiency    Past Surgical History:  Procedure Laterality Date   ACHILLES TENDON SURGERY Left 05/12/2018   Procedure: ACHILLES TENDON REPAIR;  Surgeon: Deeann Saint, MD;  Location: [facility] ORS;  Service: Orthopedics;  Laterality: Left;   BICEPS TENDON REPAIR Right 07/16/2017   HERNIA REPAIR     LAPAROSCOPY     diverticulitis   MYRINGOTOMY     NASAL SEPTUM SURGERY     Social History   Socioeconomic History   Marital status: Divorced    Spouse name: Not on file   Number of children: Not on file   Years of education: Not on file   Highest education level: Not on file  Occupational History   Not on file  Tobacco Use   Smoking status: Never    Smokeless tobacco: Current    Types: Chew  Vaping Use   Vaping Use: Never used  Substance and Sexual Activity   Alcohol use: Yes    Alcohol/week: 3.0 standard drinks    Types: 1 Glasses of wine, 2 Cans of beer per week    Comment: WINE AND BEER OCC   Drug use: No   Sexual activity: Never  Other Topics Concern   Not on file  Social History Narrative   Not on file   Social Determinants of Health   Financial Resource Strain: Not on file  Food Insecurity: Not on file  Transportation Needs: Not on file  Physical Activity: Not on file  Stress: Not on file  Social Connections: Not on file  Intimate Partner Violence: Not on file   Family Status  Relation Name Status   Father  Alive   Mother  Alive       CHF   Sister  Alive   Brother  Alive   Daughter  Alive   Son  Alive   Son  Alive   Sister  Alive   Nutritional therapist  (Not Specified)   MGF  (Not Specified)   PGF  (Not Specified)   Family History  Problem Relation Age of Onset  Heart disease Father    Heart attack Father 1476       s/p stent placement   Cancer Father        prostate gland   Arthritis Father    Arthritis Mother    Congestive Heart Failure Mother    Cancer Paternal Uncle        prostate   Heart disease Maternal Grandfather    Cancer Paternal Grandfather    Allergies  Allergen Reactions   Venlafaxine     panic atack/anxiety/"flu like" symptoms-severe   Sertraline Diarrhea and Anxiety    Patient Care Team: Maple HudsonGilbert, Faige Seely L Jr., MD as PCP - General (Family Medicine)   Medications: Outpatient Medications Prior to Visit  Medication Sig   escitalopram (LEXAPRO) 20 MG tablet Take 1 tablet by mouth daily.   gabapentin (NEURONTIN) 100 MG capsule Take 3 capsules by mouth daily.   Probiotic Product (PROBIOTIC DAILY PO) Take 1 capsule by mouth daily.    RABEprazole (ACIPHEX) 20 MG tablet Take 1 tablet (20 mg total) by mouth daily.   tadalafil (CIALIS) 20 MG tablet Take 1 tablet (20 mg total) by mouth every  3 (three) days as needed for erectile dysfunction.   tamsulosin (FLOMAX) 0.4 MG CAPS capsule Take 1 capsule (0.4 mg total) by mouth daily.   No facility-administered medications prior to visit.    Review of Systems  Constitutional: Negative.   HENT: Negative.    Eyes: Negative.   Respiratory: Negative.    Cardiovascular: Negative.   Gastrointestinal: Negative.   Endocrine: Negative.   Genitourinary: Negative.   Musculoskeletal: Negative.   Skin: Negative.   Allergic/Immunologic: Negative.   Neurological:  Positive for dizziness.  Hematological: Negative.   Psychiatric/Behavioral: Negative.        Objective     BP 136/81 (BP Location: Right Arm, Patient Position: Sitting, Cuff Size: Normal)   Pulse 69   Temp 98.3 F (36.8 C) (Oral)   Ht 5\' 8"  (1.727 m)   Wt 169 lb (76.7 kg)   SpO2 98%   BMI 25.70 kg/m      Physical Exam Constitutional:      Appearance: Normal appearance. He is normal weight.  HENT:     Head: Normocephalic and atraumatic.     Right Ear: Tympanic membrane, ear canal and external ear normal.     Left Ear: Tympanic membrane, ear canal and external ear normal.     Nose: Nose normal.     Mouth/Throat:     Mouth: Mucous membranes are moist.     Pharynx: Oropharynx is clear.  Eyes:     Extraocular Movements: Extraocular movements intact.     Conjunctiva/sclera: Conjunctivae normal.     Pupils: Pupils are equal, round, and reactive to light.  Cardiovascular:     Rate and Rhythm: Normal rate and regular rhythm.     Pulses: Normal pulses.     Heart sounds: Normal heart sounds.  Pulmonary:     Effort: Pulmonary effort is normal.     Breath sounds: Normal breath sounds.  Abdominal:     General: Abdomen is flat. Bowel sounds are normal.     Palpations: Abdomen is soft.  Genitourinary:    Penis: Normal.      Testes: Normal.     Prostate: Normal.     Rectum: Normal. Guaiac result negative.     Comments: Right inguinal hernia  present Musculoskeletal:     Cervical back: Normal range of motion.  Right lower leg: No edema.     Left lower leg: No edema.  Skin:    General: Skin is warm and dry.  Neurological:     General: No focal deficit present.     Mental Status: He is alert and oriented to person, place, and time. Mental status is at baseline.  Psychiatric:        Mood and Affect: Mood normal.        Behavior: Behavior normal.        Thought Content: Thought content normal.        Judgment: Judgment normal.      Last depression screening scores    10/30/2021   10:20 AM 06/20/2021   11:38 AM 11/28/2020    3:35 PM  PHQ 2/9 Scores  PHQ - 2 Score 2 0 0  PHQ- 9 Score 6 2 0   Last fall risk screening    10/30/2021   10:20 AM  Fall Risk   Falls in the past year? 0   Last Audit-C alcohol use screening    10/30/2021   10:20 AM  Alcohol Use Disorder Test (AUDIT)  1. How often do you have a drink containing alcohol? 4  2. How many drinks containing alcohol do you have on a typical day when you are drinking? 1  3. How often do you have six or more drinks on one occasion? 1  AUDIT-C Score 6   A score of 3 or more in women, and 4 or more in men indicates increased risk for alcohol abuse, EXCEPT if all of the points are from question 1   No results found for any visits on 10/30/21.  Assessment & Plan    Routine Health Maintenance and Physical Exam  Exercise Activities and Dietary recommendations  Goals   None     Immunization History  Administered Date(s) Administered   Influenza,inj,Quad PF,6+ Mos 02/22/2015, 03/12/2017, 04/08/2018, 02/26/2019   Tdap 07/25/2010    Health Maintenance  Topic Date Due   COVID-19 Vaccine (1) Never done   HIV Screening  Never done   Zoster Vaccines- Shingrix (1 of 2) Never done   TETANUS/TDAP  07/25/2020   INFLUENZA VACCINE  01/16/2022   COLONOSCOPY (Pts 45-52yrs Insurance coverage will need to be confirmed)  06/28/2027   Hepatitis C Screening  Completed    HPV VACCINES  Aged Out    Discussed health benefits of physical activity, and encouraged him to engage in regular exercise appropriate for his age and condition.  1. Annual physical exam Screenings up-to-date.  Need to update tetanus - CBC with Differential/Platelet - Comprehensive metabolic panel - Lipid Panel With LDL/HDL Ratio - TSH  2. Prostate cancer screening  - PSA  3. Gastroesophageal reflux disease without esophagitis Aciphex every other day  4. Benign prostatic hyperplasia with urinary frequency   5. Anxiety Using gabapentin as needed with some relief  6. Recurrent major depressive disorder, in partial remission (HCC) Followed by psychiatry  7. Inguinal hernia of right side without obstruction or gangrene Pain-free presently.  Refer to surgery at his request  8. Attention deficit hyperactivity disorder (ADHD), unspecified ADHD type    No follow-ups on file.     I, Megan Mans, MD, have reviewed all documentation for this visit. The documentation on 11/02/21 for the exam, diagnosis, procedures, and orders are all accurate and complete.    Tandra Rosado Wendelyn Breslow, MD  [facility] [phone] (phone) [phone] (fax)  [facility]

## 2021-10-31 LAB — CBC WITH DIFFERENTIAL/PLATELET
Basophils Absolute: 0.1 10*3/uL (ref 0.0–0.2)
Basos: 2 %
EOS (ABSOLUTE): 0.1 10*3/uL (ref 0.0–0.4)
Eos: 2 %
Hematocrit: 47.3 % (ref 37.5–51.0)
Hemoglobin: 16.2 g/dL (ref 13.0–17.7)
Immature Grans (Abs): 0 10*3/uL (ref 0.0–0.1)
Immature Granulocytes: 1 %
Lymphocytes Absolute: 1.4 10*3/uL (ref 0.7–3.1)
Lymphs: 30 %
MCH: 31.5 pg (ref 26.6–33.0)
MCHC: 34.2 g/dL (ref 31.5–35.7)
MCV: 92 fL (ref 79–97)
Monocytes Absolute: 0.6 10*3/uL (ref 0.1–0.9)
Monocytes: 12 %
Neutrophils Absolute: 2.6 10*3/uL (ref 1.4–7.0)
Neutrophils: 53 %
Platelets: 223 10*3/uL (ref 150–450)
RBC: 5.15 x10E6/uL (ref 4.14–5.80)
RDW: 13 % (ref 11.6–15.4)
WBC: 4.8 10*3/uL (ref 3.4–10.8)

## 2021-10-31 LAB — COMPREHENSIVE METABOLIC PANEL
ALT: 24 IU/L (ref 0–44)
AST: 32 IU/L (ref 0–40)
Albumin/Globulin Ratio: 1.7 (ref 1.2–2.2)
Albumin: 4.6 g/dL (ref 3.8–4.8)
Alkaline Phosphatase: 68 IU/L (ref 44–121)
BUN/Creatinine Ratio: 10 (ref 10–24)
BUN: 9 mg/dL (ref 8–27)
Bilirubin Total: 0.3 mg/dL (ref 0.0–1.2)
CO2: 21 mmol/L (ref 20–29)
Calcium: 9.2 mg/dL (ref 8.6–10.2)
Chloride: 102 mmol/L (ref 96–106)
Creatinine, Ser: 0.93 mg/dL (ref 0.76–1.27)
Globulin, Total: 2.7 g/dL (ref 1.5–4.5)
Glucose: 82 mg/dL (ref 70–99)
Potassium: 4.7 mmol/L (ref 3.5–5.2)
Sodium: 141 mmol/L (ref 134–144)
Total Protein: 7.3 g/dL (ref 6.0–8.5)
eGFR: 93 mL/min/{1.73_m2} (ref >59)

## 2021-10-31 LAB — LIPID PANEL WITH LDL/HDL RATIO
Cholesterol, Total: 248 mg/dL — ABNORMAL HIGH (ref 100–199)
HDL: 62 mg/dL (ref >39)
LDL Chol Calc (NIH): 136 mg/dL — ABNORMAL HIGH (ref 0–99)
LDL/HDL Ratio: 2.2 ratio (ref 0.0–3.6)
Triglycerides: 281 mg/dL — ABNORMAL HIGH (ref 0–149)
VLDL Cholesterol Cal: 50 mg/dL — ABNORMAL HIGH (ref 5–40)

## 2021-10-31 LAB — PSA: Prostate Specific Ag, Serum: 4.9 ng/mL — ABNORMAL HIGH (ref 0.0–4.0)

## 2021-10-31 LAB — TSH: TSH: 1.95 u[IU]/mL (ref 0.450–4.500)

## 2021-11-03 ENCOUNTER — Telehealth: Payer: Self-pay

## 2021-11-03 NOTE — Telephone Encounter (Signed)
-----   Message from Maple Hudson., MD sent at 11/03/2021  8:28 AM EDT ----- Labs stable.  PSA mildly elevated, refer to urology please advise patient.

## 2021-11-03 NOTE — Telephone Encounter (Signed)
Patient aware. Patient is going to call back with the urologist that he wants to be refer to. Reports that he is with [facility] but can't remember the doctors name. Also states that his insurance is in-network with [facility] specialist.

## 2021-11-06 ENCOUNTER — Telehealth: Payer: Self-pay

## 2021-11-06 DIAGNOSIS — R972 Elevated prostate specific antigen [PSA]: Secondary | ICD-10-CM

## 2021-11-06 NOTE — Telephone Encounter (Signed)
Dr. Sullivan Lone wanted to refer pt. To a urologist.  Patient wants to see urology at  [facility] office  [phone] phone # and the fax # is [phone].  His [facility] record # is [id number].

## 2021-11-07 NOTE — Addendum Note (Signed)
Addended by: Hyacinth Meeker on: 11/07/2021 03:25 PM   Modules accepted: Orders

## 2021-11-07 NOTE — Telephone Encounter (Signed)
See next message

## 2021-11-23 ENCOUNTER — Other Ambulatory Visit: Payer: Self-pay | Admitting: Family Medicine

## 2021-11-23 DIAGNOSIS — N400 Enlarged prostate without lower urinary tract symptoms: Secondary | ICD-10-CM

## 2021-12-25 ENCOUNTER — Other Ambulatory Visit: Payer: Self-pay | Admitting: Family Medicine

## 2021-12-25 DIAGNOSIS — N400 Enlarged prostate without lower urinary tract symptoms: Secondary | ICD-10-CM

## 2022-01-29 ENCOUNTER — Other Ambulatory Visit: Payer: Self-pay | Admitting: Family Medicine

## 2022-01-29 DIAGNOSIS — N400 Enlarged prostate without lower urinary tract symptoms: Secondary | ICD-10-CM

## 2022-05-02 ENCOUNTER — Ambulatory Visit: Payer: BLUE CROSS/BLUE SHIELD | Admitting: Family Medicine

## 2022-05-23 ENCOUNTER — Encounter: Payer: Self-pay | Admitting: Physician Assistant

## 2022-05-23 ENCOUNTER — Ambulatory Visit
Admission: RE | Admit: 2022-05-23 | Discharge: 2022-05-23 | Disposition: A | Discharge status: Home / Self Care | Payer: BLUE CROSS/BLUE SHIELD | Attending: Physician Assistant | Admitting: Physician Assistant

## 2022-05-23 ENCOUNTER — Ambulatory Visit (INDEPENDENT_AMBULATORY_CARE_PROVIDER_SITE_OTHER): Payer: BLUE CROSS/BLUE SHIELD | Admitting: Physician Assistant

## 2022-05-23 ENCOUNTER — Ambulatory Visit
Admission: RE | Admit: 2022-05-23 | Discharge: 2022-05-23 | Disposition: A | Discharge status: Home / Self Care | Payer: BLUE CROSS/BLUE SHIELD | Source: Ambulatory Visit | Attending: Physician Assistant | Admitting: Physician Assistant

## 2022-05-23 VITALS — BP 106/68 | HR 72 | Temp 98.6°F | Resp 16 | Wt 170.5 lb

## 2022-05-23 DIAGNOSIS — R0989 Other specified symptoms and signs involving the circulatory and respiratory systems: Secondary | ICD-10-CM | POA: Insufficient documentation

## 2022-05-23 DIAGNOSIS — R058 Other specified cough: Secondary | ICD-10-CM | POA: Diagnosis not present

## 2022-05-23 DIAGNOSIS — R0789 Other chest pain: Secondary | ICD-10-CM

## 2022-05-23 DIAGNOSIS — R0600 Dyspnea, unspecified: Secondary | ICD-10-CM | POA: Insufficient documentation

## 2022-05-23 NOTE — Progress Notes (Signed)
I,Sulibeya S Dimas,acting as a Neurosurgeon for [employer], PA-C.,have documented all relevant documentation on the behalf of Debera Lat, PA-C,as directed by  [employer], PA-C while in the presence of [employer], PA-C.     Established patient visit   Patient: Ronald M Swaziland Jr.   DOB: 11-25-1960   61 y.o. Male  MRN: [id number] Visit Date: 05/23/2022  Today's healthcare provider: Debera Lat, PA-C   Chief Complaint  Patient presents with   URI   Subjective    HPI  Upper respiratory symptoms He complains of productive cough with  white colored sputum, shortness of breath, sinus pressure, and wheezing.with no fever, chills, night sweats or weight loss. Onset of symptoms was  9 days ago and staying constant.He is drinking plenty of fluids.  Past history is significant for no history of pneumonia or bronchitis. Patient is non-smoker. Patient denies any COVID contacts. Patient is not taking any OTC medications for cough.  His son had similar symptoms 2 weeks ago and had more mild symptoms. He was told that he had a viral infection. ---------------------------------------------------------------------------------------------------  Medications: Outpatient Medications Prior to Visit  Medication Sig   escitalopram (LEXAPRO) 20 MG tablet Take 1 tablet by mouth daily.   gabapentin (NEURONTIN) 100 MG capsule Take 3 capsules by mouth daily.   Probiotic Product (PROBIOTIC DAILY PO) Take 1 capsule by mouth daily.    tadalafil (CIALIS) 20 MG tablet Take 1 tablet (20 mg total) by mouth every 3 (three) days as needed for erectile dysfunction.   tamsulosin (FLOMAX) 0.4 MG CAPS capsule Take 1 capsule (0.4 mg total) by mouth daily.   RABEprazole (ACIPHEX) 20 MG tablet Take 1 tablet (20 mg total) by mouth daily. (Patient not taking: Reported on 05/23/2022)   No facility-administered medications prior to visit.    Review of Systems  Respiratory:  Positive for cough, chest tightness, shortness  of breath and wheezing.        Objective    BP 106/68 (BP Location: Right Arm, Patient Position: Sitting, Cuff Size: Large)   Pulse 72   Temp 98.6 F (37 C) (Oral)   Resp 16   Wt 170 lb 8 oz (77.3 kg)   SpO2 96%   BMI 25.92 kg/m  BP Readings from Last 3 Encounters:  05/23/22 106/68  10/30/21 136/81  06/20/21 128/81   Wt Readings from Last 3 Encounters:  05/23/22 170 lb 8 oz (77.3 kg)  10/30/21 169 lb (76.7 kg)  06/20/21 167 lb (75.8 kg)   Physical Exam Vitals reviewed.  Constitutional:      General: He is in acute distress.     Appearance: Normal appearance.  HENT:     Head: Normocephalic and atraumatic.     Right Ear: Ear canal and external ear normal. There is impacted cerumen (partially).     Left Ear: Ear canal and external ear normal. There is impacted cerumen (partially).     Nose: Congestion and rhinorrhea present.     Mouth/Throat:     Pharynx: Posterior oropharyngeal erythema (mild) present. No oropharyngeal exudate.  Eyes:     General: No scleral icterus.       Right eye: No discharge.        Left eye: No discharge.     Extraocular Movements: Extraocular movements intact.     Conjunctiva/sclera: Conjunctivae normal.     Pupils: Pupils are equal, round, and reactive to light.  Cardiovascular:     Rate and Rhythm: Normal rate and regular  rhythm.     Pulses: Normal pulses.     Heart sounds: Normal heart sounds.  Pulmonary:     Breath sounds: Rales present. No wheezing.  Chest:     Chest wall: Tenderness present.  Abdominal:     General: Abdomen is flat. Bowel sounds are normal.     Palpations: Abdomen is soft.  Musculoskeletal:        General: Normal range of motion.     Cervical back: Normal range of motion.  Skin:    General: Skin is warm.  Neurological:     General: No focal deficit present.     Mental Status: He is alert and oriented to person, place, and time.  Psychiatric:        Behavior: Behavior normal.        Thought Content: Thought  content normal.        Judgment: Judgment normal.     Assessment/Plan 1. Abnormal lung sounds 2. Other cough 3. Chest tightness 4. Dyspnea, unspecified type Could be due to URI, pneumonia, bronchitis Normal vitals except slight increase in body temperature/37 degree by celsius No history of chronic respiratory conditions Patient does not smoke  - DG Chest 2 View; Future Symptomatic treatment advised  Increase fluids.  Rest.  Saline nasal spray. Mucinex as directed.  Humidifier in bedroom. Flonase  Call or return to clinic if symptoms are not improving.   Will reassess after receiving chest x-ray results  The patient was advised to call back or seek an in-person evaluation if the symptoms worsen or if the condition fails to improve as anticipated.  I discussed the assessment and treatment plan with the patient. The patient was provided an opportunity to ask questions and all were answered. The patient agreed with the plan and demonstrated an understanding of the instructions.  The entirety of the information documented in the History of Present Illness, Review of Systems and Physical Exam were personally obtained by me. Portions of this information were initially documented by the CMA and reviewed by me for thoroughness and accuracy.  Debera Lat, [facility], MMS [facility] [phone] (phone) [phone] (fax)

## 2022-05-24 MED ORDER — AZITHROMYCIN 250 MG PO TABS: ORAL_TABLET | ORAL | 0 refills | Status: AC

## 2022-05-25 NOTE — Progress Notes (Signed)
Pt was advised on CXR results. Med was sent to his pharmacy.

## 2024-08-04 ENCOUNTER — Ambulatory Visit: Admitting: Cardiovascular Disease
